# Patient Record
Sex: Male | Born: 1950 | Race: White | Hispanic: No | Marital: Married | Smoking: Never smoker
Health system: Southern US, Community
[De-identification: ages and names within clinical notes are randomized; demographics above are authoritative.]

## PROBLEM LIST (undated history)

## (undated) DIAGNOSIS — R112 Nausea with vomiting, unspecified: Secondary | ICD-10-CM

## (undated) DIAGNOSIS — M549 Dorsalgia, unspecified: Secondary | ICD-10-CM

## (undated) DIAGNOSIS — Z9889 Other specified postprocedural states: Secondary | ICD-10-CM

## (undated) DIAGNOSIS — G8929 Other chronic pain: Secondary | ICD-10-CM

## (undated) HISTORY — PX: BACK SURGERY: SHX140

---

## 2000-06-01 ENCOUNTER — Encounter: Admission: RE | Admit: 2000-06-01 | Discharge: 2000-08-30 | Payer: Self-pay | Admitting: Occupational Medicine

## 2000-07-06 ENCOUNTER — Encounter: Admission: RE | Admit: 2000-07-06 | Discharge: 2000-07-06 | Payer: Self-pay | Admitting: Occupational Medicine

## 2000-07-06 ENCOUNTER — Encounter: Payer: Self-pay | Admitting: Occupational Medicine

## 2000-08-18 ENCOUNTER — Inpatient Hospital Stay (HOSPITAL_COMMUNITY): Admission: AD | Admit: 2000-08-18 | Discharge: 2000-08-19 | Payer: Self-pay | Admitting: Neurosurgery

## 2000-08-18 ENCOUNTER — Encounter: Payer: Self-pay | Admitting: Neurosurgery

## 2000-11-17 ENCOUNTER — Encounter: Payer: Self-pay | Admitting: Neurosurgery

## 2000-11-17 ENCOUNTER — Ambulatory Visit (HOSPITAL_COMMUNITY): Admission: RE | Admit: 2000-11-17 | Discharge: 2000-11-17 | Payer: Self-pay | Admitting: Neurosurgery

## 2000-12-08 ENCOUNTER — Encounter: Payer: Self-pay | Admitting: Neurosurgery

## 2000-12-08 ENCOUNTER — Encounter: Admission: RE | Admit: 2000-12-08 | Discharge: 2000-12-08 | Payer: Self-pay | Admitting: Neurosurgery

## 2000-12-22 ENCOUNTER — Encounter: Admission: RE | Admit: 2000-12-22 | Discharge: 2000-12-22 | Payer: Self-pay | Admitting: Neurosurgery

## 2000-12-22 ENCOUNTER — Encounter: Payer: Self-pay | Admitting: Neurosurgery

## 2001-01-10 ENCOUNTER — Encounter: Admission: RE | Admit: 2001-01-10 | Discharge: 2001-01-10 | Payer: Self-pay | Admitting: Neurosurgery

## 2001-01-10 ENCOUNTER — Encounter: Payer: Self-pay | Admitting: Neurosurgery

## 2001-05-01 ENCOUNTER — Ambulatory Visit (HOSPITAL_BASED_OUTPATIENT_CLINIC_OR_DEPARTMENT_OTHER): Admission: RE | Admit: 2001-05-01 | Discharge: 2001-05-01 | Payer: Self-pay | Admitting: Plastic Surgery

## 2002-02-21 ENCOUNTER — Encounter (INDEPENDENT_AMBULATORY_CARE_PROVIDER_SITE_OTHER): Payer: Self-pay | Admitting: Specialist

## 2002-02-21 ENCOUNTER — Ambulatory Visit (HOSPITAL_COMMUNITY): Admission: RE | Admit: 2002-02-21 | Discharge: 2002-02-21 | Payer: Self-pay | Admitting: Gastroenterology

## 2002-02-21 ENCOUNTER — Encounter: Payer: Self-pay | Admitting: Gastroenterology

## 2006-09-02 ENCOUNTER — Encounter: Admission: RE | Admit: 2006-09-02 | Discharge: 2006-09-02 | Payer: Self-pay | Admitting: Neurosurgery

## 2010-06-26 NOTE — Op Note (Signed)
[place]. [facility]  Patient:    Thomas Navarro, Thomas Navarro Visit Number: [id number] MRN: [id number]          Service Type: DSU Location: [facility] Attending Physician:  Loura Halt Ii Dictated by:   Alfredia Ferguson, M.D. Proc. Date: 05/01/01 Admit Date:  05/01/2001 Discharge Date: 05/01/2001   CC:         Dr. Nita Sells   Operative Report  PREOPERATIVE DIAGNOSIS:  A 2 cm soft tissue mass, right temple.  POSTOPERATIVE DIAGNOSIS:  A 2 cm soft tissue mass, right temple.  OPERATION PERFORMED:  Excision of right temple soft tissue mass.  SURGEON:  Alfredia Ferguson, M.D.  ANESTHESIA:  2% Xylocaine 1:100,000 epinephrine.  INDICATIONS FOR PROCEDURE:  This is a 60 year old gentleman with a slowly enlarging subcutaneous mass in his right temple area.  He was evaluated by Dr. Nita Sells, who recommended removal.  He was referred to my office for this procedure.  The patient understands he will be trading what he has for a permanent potentially unsightly scar.  He understands the risk of recurrence of the lesion.  Clinically it appears to be a lipoma.  DESCRIPTION OF PROCEDURE:  Skin markers were placed directly over the lipoma and local anesthesia was infiltrated.  The right side of the face was prepped and draped in sterile fashion.  After waiting approximately 10 minutes, an incision was made directly over the lesion and using a combination of sharp and blunt dissection, the lipoma was visualized.  The lipoma was dissected out carefully to minimize any damage to the temporal branch of the facial nerve. It was removed in total.  The lesion was passed off for pathology.  Hemostasis was accomplished using electrocautery.  The wound was closed by approximating the dermis using interrupted 5-0 Vicryl suture.  The skin was united using a running 6-0 nylon suture.  The patient was discharged home in satisfactory condition. Dictated by:   Alfredia Ferguson,  M.D. Attending Physician:  Loura Halt Ii DD:  05/17/01 TD:  05/17/01 Job: [id number]

## 2010-06-26 NOTE — Op Note (Signed)
[place]. [facility]  Patient:    Thomas Navarro, Thomas Navarro                      MRN: [id number] Proc. Date: 08/18/00 Adm. Date:  [id number] Attending:  Barton Fanny                           Operative Report  PREOPERATIVE DIAGNOSIS:  Right L5-S1 lumbar disk herniation.  POSTOPERATIVE DIAGNOSIS:  Right L5-S1 lumbar disk herniation.  PROCEDURE:  Right L5-S1 lumbar laminotomy and microdiskectomy.  SURGEON:  Hewitt Shorts, M.D.  ASSISTANT:  Stefani Dama, M.D.  ANESTHESIA:  General endotracheal anesthesia.  INDICATIONS:  The patient is a 60 year old man who presented with a right lumbar radiculopathy that was found to be secondary to a right L5-S1 lumbar disk herniation.  Decision was made to proceed with a left laminotomy and microdiskectomy.  DESCRIPTION OF PROCEDURE:  The patient was brought to the operating room and placed under general endotracheal anesthesia.  The patient was turned to a prone position and the lumbar region was prepped with Betadine soap solution, draped in a sterile fashion.  The midline was infiltrated with local anesthetic with epinephrine and a midline incision was made, carried down through the subcutaneous tissue.  Bipolar cautery and electrocautery used to maintain hemostasis.  Dissection was carried down to the lumbar fascia which was incised on the right side of the midline in the paraspinal muscle with dissection of the spinous process and lamina in a subperiosteal fashion.  The L5-S1 intralaminar space was identified and x-ray was taken to confirm the localization, and then we proceeded with the laminotomy using the [facility] Max drill and Kerrison punches.  The microscope was draped and brought into the field to provide additional magnification, identification, illumination and visualization, and the remainder of the procedure was performed using microdissection and microsurgical technique.  The ligamentum of  flavum was carefully resected and we identified the thecal sac and right S1 nerve root. These were gently retracted medially revealing a subligamentous disk herniation.  The annulus was incised and a diskectomy was performed removing all lose fragments of disk material from both the disk space and from within the annulus as well as from the epidural space.  In the end all lose fragments were removed and good decompression of the thecal sac and nerve root was achieved.  The wound was irrigated with bacitracin solution.  A check for hemostasis was established with use of bipolar cautery and then we proceeded with closure. Prior to closure 2 cc of Fentanyl and 80 mg of Depo-Medrol were infused into the epidural space.  Deep fascia closed with interrupted undyed 1 Vicryl sutures and the subcutaneous and subcuticular were closed with interrupted and inverted 2-0 running Vicryl sutures, and the skin was reapproximated with Dermabond.  The patient tolerated the procedure well.  ESTIMATED BLOOD LOSS:  less than 50 cc.  SPONGE AND NEEDLE COUNT:  Correct.  Following surgery, the patient was turned back to the supine position to be reversed from the anesthetic, extubated and was transferred to the recovery room for further care. DD:  08/18/00 TD:  08/18/00 Job: [id number]

## 2010-06-26 NOTE — H&P (Signed)
[place]. [facility]  Patient:    Thomas Navarro, Thomas Navarro                      MRN: [id number] Adm. Date:  [id number] Attending:  Barton Fanny                         History and Physical  HISTORY OF PRESENT ILLNESS:  The patient is a 60 year old right-handed white male who was evaluated for a right lumbar radiculopathy with right L5-S1 lumbar disk herniation.  He injured himself in March 2002.  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  He initially had quite a bit of pain but it eased but he has continued to have complaints of pain in the right side of his low back extending down to the right buttock and posterior thigh and at times into the right calf with numbness and tingling in the right foot.  He does not describe any specific weakness.  He denies any symptoms in his left lower extremity.  He has continued working.  He has been treated with Celebrex, which he took for some time and it did help but then he began to not tolerate the side effects of the medication and he had to discontinue it.  He has also used some Skelaxin and underwent three weeks of physical therapy at [facility].  Again, this helped some but none of these have completely relieved his radicular pain.  He is an active person and likes to remain active both at work and at home and he has been limited because of his discomfort.  The patient is admitted now for right L5-S1 lumbar laminotomy and microdiskectomy.  PAST MEDICAL HISTORY:  There is no history of hypertension, myocardial infarction, cancer, stroke, diabetes, peptic ulcer disease, or lung disease.  PAST SURGICAL HISTORY:  No previous surgeries.  ALLERGIES:  No known allergies.  MEDICATIONS:  He is currently not taking any medications.  FAMILY HISTORY:  His mother died at age 34 of breast cancer and his father is in good health with a history of heart disease at age 56.  SOCIAL  HISTORY:  The patient is married.  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  He does not smoke.  He drinks alcoholic beverages socially.  He denies history of substance abuse.  REVIEW OF SYSTEMS:  Notable for those difficulties described in his history of present illness and past medical history.  PHYSICAL EXAMINATION:  GENERAL:  The patient is a well-developed, well-nourished white male in no acute distress.  VITAL SIGNS:  Temperature 98.3, pulse 62, blood pressure 138/66, respiratory rate 16, height 5 feet 11 inches, weight 221 pounds.  LUNGS:  Clear to auscultation.  He has symmetrical respiration excursion.  HEART:  Regular rate and rhythm.  Normal S1, S2.  There is no murmur.  ABDOMEN:  Soft, nondistended.  Bowel sounds are present.  EXTREMITIES:  No clubbing, cyanosis, or edema.  MUSCULOSKELETAL:  No particular tenderness to palpation over the lumbar spinous process or ______ musculature.  He is able to flex 90 degrees.  He is able to extend.  He has more discomfort with flexion than with extension. Straight leg raising is negative bilaterally.  NEUROLOGIC:  5/5 strength in the distal lower extremities.   Equal in the dorsiflexor, plantiflexor, and extensor hallucis longus.  Sensation is  somewhat decreased to pinprick in the right foot as compared to the left foot. Reflexes are 1 at the quadriceps and gastrocnemii symmetric bilaterally.  Toes are downgoing bilaterally.  He has a normal gait and stance.  LABORATORY DATA:  MRI from [facility] shows desiccation of the L5-S1 disk with a small right L5-S1 lumbar disk herniation.  IMPRESSION:  Right lumbar radiculopathy secondary to a right L5-S1 lumbar disk herniation.  PLAN:  The patient will be admitted for a right L5-S1 lumbar laminotomy and microdiskectomy.  We discussed alternatives to surgery, the nature of the surgical procedure itself, typical length of surgery and hospital stay and overall  recuperation; and risks of surgery including risk of infection, bleeding, possibility of transfusion, the risk of nerve dysfunction, pain, weakness, ______, the risk of recurrent disk herniation, possible need for further surgery, the risk of dural tear and CSF leakage; and anesthetic risks of myocardial infarction, stroke, pneumonia, and death.  Understanding all of this, he does with to proceed with surgery and is admitted for such. DD:  08/18/00 TD:  08/18/00 Job: [id number]

## 2011-03-23 ENCOUNTER — Ambulatory Visit (HOSPITAL_BASED_OUTPATIENT_CLINIC_OR_DEPARTMENT_OTHER): Payer: Managed Care, Other (non HMO) | Admitting: Physical Medicine & Rehabilitation

## 2011-03-23 ENCOUNTER — Encounter: Payer: Managed Care, Other (non HMO) | Attending: Physical Medicine & Rehabilitation

## 2011-03-23 DIAGNOSIS — IMO0002 Reserved for concepts with insufficient information to code with codable children: Secondary | ICD-10-CM

## 2011-03-23 DIAGNOSIS — M961 Postlaminectomy syndrome, not elsewhere classified: Secondary | ICD-10-CM

## 2011-03-23 DIAGNOSIS — IMO0001 Reserved for inherently not codable concepts without codable children: Secondary | ICD-10-CM | POA: Insufficient documentation

## 2011-03-23 DIAGNOSIS — M79609 Pain in unspecified limb: Secondary | ICD-10-CM | POA: Insufficient documentation

## 2011-03-23 DIAGNOSIS — Z79899 Other long term (current) drug therapy: Secondary | ICD-10-CM | POA: Insufficient documentation

## 2011-03-23 DIAGNOSIS — G8929 Other chronic pain: Secondary | ICD-10-CM | POA: Insufficient documentation

## 2011-03-23 DIAGNOSIS — G96198 Other disorders of meninges, not elsewhere classified: Secondary | ICD-10-CM | POA: Insufficient documentation

## 2011-03-23 NOTE — Consult Note (Signed)
Consult requested by Dr. Andrey Campanile over at the [facility].  REASON FOR CONSULTATION:  Right lower extremity pain.  CHIEF COMPLAINT:  Right lower extremity pain.  HISTORY:  A 61 year old male who has a history of chronic back and lower extremity pain.  He rates his current pain as 6/10.  It goes down the back of his buttocks into the posterior thigh, into his calf, and then into his foot.  His big toe is the numbest.  He has undergone epidural steroid injections in the past.  MRI of the spine has been done in 2002, which was the initial, showing a paracentral disk herniation approaching the right S1 nerve root, had a repeat after laminectomy on August 18, 2000, which showed the laminotomy defect, some perineural fibrosis, and foraminal stenosis at L5-S1.  He has had epidural steroids transforaminal right L5 performed on December 08, 2000; December 22, 2000; and January 10, 2001, which were helpful.  He has not had any recent injections.  A repeat MRI of the lumbar spine August 03, 2006, demonstrating some endplate reactive changes L5-S1, moderate facet hypertrophy L4-5, right greater than left foraminal stenosis at L5-S1, facet arthropathy and foraminal and extra foraminal protrusion.  I reviewed the MRIs on the [facility] system as well.  Foraminal stenosis on the right is moderate.  Average pain score is 6, sharp and burning down the right lower extremity.  He can walk without problems.  He has problems with prolonged sitting.  He is able climb steps.  He is able to drive.  Needs help with certain household duties.  He has numbness and spasms on his review of systems as well as weight gain.  SOCIAL HISTORY:  Married, lives with his wife.  Nonsmoker and nondrinker. 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  He really wants to hold off on any type of surgery until November 2013.  Opioid risk tool score of 0.  MEDICATIONS: 1. Gabapentin 300 mg 2 during the day and 3  at night. 2. Nabumetone 500 mg b.i.d. 3. AndroGel 2 pumps daily. 4. Diazepam 5 mg t.i.d. 5. OxyContin 10 mg q.12. 6. Hydrocodone 5/325 one to two q.4-6.  I also reviewed records from Dr. Mellody Life office as well as Dr. Tawana Scale office.  I also reviewed MRI report, but not the actual films, March 03, 2010, which was not significantly different than prior exam.  Oswestry score of 48%.  PHYSICAL EXAMINATION:  VITAL SIGNS:  Blood pressure 149/88, pulse 88, respiratory rate 16, O2 saturation 98% on room air, weight 208 pounds, and height 5 feet 11 inches. GENERAL:  An overweight male, in no acute distress.  Orientation x3. Mood and affect are appropriate. MUSCULOSKELETAL:  Upper extremity strength, range of motion, sensation, and deep tendon reflexes are normal.  Lower extremity strength and deep tendon reflexes are normal.  Sensation reduced in the right L5 dermatomal distribution.  He has a weak EHL on the right side, but otherwise has good strength in the lower extremities.  Gait shows no evidence of toe drag or knee instability, although he walks slowly trying to have a soft heel strike on the right side.  IMPRESSION: 1. 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  He is     contemplating surgery in the future, but would like to trial some     conservative care for the next 9 months at minimum.  We will set     him up for L5 transforaminal injection given that he  has had good     luck with these in the past prior to his surgery. 2. Start some nortriptyline. 3. Check urine drug screen.  Consider other options in terms of     narcotic analgesics such as Nucynta, which may address his     neurogenic pain better than the OxyContin and hydrocodone, tend to     be less sedating as well given type of work he does.  Discussed with the patient, agrees with plan.     Erick Colace, M.D. Electronically Signed    AEK/MedQ D:03/23/2011 15:58:00  T:03/23/2011  22:17:10  Job #:  [id number]  cc:   Hewitt Shorts, M.D. Fax: [phone]

## 2011-03-30 ENCOUNTER — Other Ambulatory Visit: Payer: Self-pay | Admitting: *Deleted

## 2011-03-30 MED ORDER — HYDROCODONE-ACETAMINOPHEN 7.5-325 MG PO TABS: 1.0000 | ORAL_TABLET | Freq: Four times a day (QID) | ORAL | Status: AC

## 2012-02-24 ENCOUNTER — Other Ambulatory Visit: Payer: Self-pay | Admitting: Internal Medicine

## 2012-02-24 DIAGNOSIS — K7460 Unspecified cirrhosis of liver: Secondary | ICD-10-CM

## 2012-02-24 DIAGNOSIS — B1920 Unspecified viral hepatitis C without hepatic coma: Secondary | ICD-10-CM

## 2012-04-13 ENCOUNTER — Ambulatory Visit
Admission: RE | Admit: 2012-04-13 | Discharge: 2012-04-13 | Disposition: A | Discharge status: Home / Self Care | Payer: Managed Care, Other (non HMO) | Source: Ambulatory Visit | Attending: Internal Medicine | Admitting: Internal Medicine

## 2012-04-13 DIAGNOSIS — B1920 Unspecified viral hepatitis C without hepatic coma: Secondary | ICD-10-CM

## 2012-04-13 DIAGNOSIS — K7460 Unspecified cirrhosis of liver: Secondary | ICD-10-CM

## 2012-04-27 ENCOUNTER — Other Ambulatory Visit: Payer: Self-pay | Admitting: Physician Assistant

## 2012-04-27 DIAGNOSIS — K7689 Other specified diseases of liver: Secondary | ICD-10-CM

## 2012-05-04 ENCOUNTER — Ambulatory Visit
Admission: RE | Admit: 2012-05-04 | Discharge: 2012-05-04 | Disposition: A | Discharge status: Home / Self Care | Payer: Managed Care, Other (non HMO) | Source: Ambulatory Visit | Attending: Physician Assistant | Admitting: Physician Assistant

## 2012-05-04 DIAGNOSIS — K7689 Other specified diseases of liver: Secondary | ICD-10-CM

## 2012-08-01 ENCOUNTER — Telehealth: Payer: Self-pay | Admitting: *Deleted

## 2012-08-01 NOTE — Telephone Encounter (Signed)
I called and left a message for the patient to callback to the office to r/s appt.with Dr. Willis. 

## 2013-03-13 ENCOUNTER — Emergency Department (HOSPITAL_COMMUNITY)
Admission: EM | Admit: 2013-03-13 | Discharge: 2013-03-13 | Disposition: A | Discharge status: Home / Self Care | Payer: Managed Care, Other (non HMO) | Attending: Emergency Medicine | Admitting: Emergency Medicine

## 2013-03-13 ENCOUNTER — Emergency Department (HOSPITAL_COMMUNITY): Payer: Managed Care, Other (non HMO)

## 2013-03-13 ENCOUNTER — Encounter (HOSPITAL_COMMUNITY): Payer: Self-pay | Admitting: Emergency Medicine

## 2013-03-13 DIAGNOSIS — Z79899 Other long term (current) drug therapy: Secondary | ICD-10-CM | POA: Insufficient documentation

## 2013-03-13 DIAGNOSIS — G8929 Other chronic pain: Secondary | ICD-10-CM | POA: Insufficient documentation

## 2013-03-13 DIAGNOSIS — N23 Unspecified renal colic: Secondary | ICD-10-CM

## 2013-03-13 DIAGNOSIS — D696 Thrombocytopenia, unspecified: Secondary | ICD-10-CM

## 2013-03-13 DIAGNOSIS — M549 Dorsalgia, unspecified: Secondary | ICD-10-CM | POA: Insufficient documentation

## 2013-03-13 DIAGNOSIS — R1032 Left lower quadrant pain: Secondary | ICD-10-CM | POA: Insufficient documentation

## 2013-03-13 DIAGNOSIS — R109 Unspecified abdominal pain: Secondary | ICD-10-CM

## 2013-03-13 DIAGNOSIS — R1031 Right lower quadrant pain: Secondary | ICD-10-CM | POA: Insufficient documentation

## 2013-03-13 HISTORY — DX: Other chronic pain: G89.29

## 2013-03-13 HISTORY — DX: Dorsalgia, unspecified: M54.9

## 2013-03-13 LAB — URINE MICROSCOPIC-ADD ON

## 2013-03-13 LAB — URINALYSIS, ROUTINE W REFLEX MICROSCOPIC
BILIRUBIN URINE: NEGATIVE
Glucose, UA: 250 mg/dL — AB
KETONES UR: NEGATIVE mg/dL
Leukocytes, UA: NEGATIVE
Nitrite: NEGATIVE
Protein, ur: NEGATIVE mg/dL
SPECIFIC GRAVITY, URINE: 1.045 — AB (ref 1.005–1.030)
UROBILINOGEN UA: 0.2 mg/dL (ref 0.0–1.0)
pH: 5 (ref 5.0–8.0)

## 2013-03-13 LAB — COMPREHENSIVE METABOLIC PANEL
ALT: 66 U/L — AB (ref 0–53)
AST: 29 U/L (ref 0–37)
Albumin: 3 g/dL — ABNORMAL LOW (ref 3.5–5.2)
Alkaline Phosphatase: 150 U/L — ABNORMAL HIGH (ref 39–117)
BUN: 14 mg/dL (ref 6–23)
CALCIUM: 10.4 mg/dL (ref 8.4–10.5)
CHLORIDE: 100 meq/L (ref 96–112)
CO2: 19 meq/L (ref 19–32)
Creatinine, Ser: 1.34 mg/dL (ref 0.50–1.35)
GFR calc Af Amer: 65 mL/min — ABNORMAL LOW (ref >90)
GFR, EST NON AFRICAN AMERICAN: 56 mL/min — AB (ref >90)
GLUCOSE: 185 mg/dL — AB (ref 70–99)
Potassium: 4.7 mEq/L (ref 3.7–5.3)
SODIUM: 134 meq/L — AB (ref 137–147)
Total Bilirubin: 0.7 mg/dL (ref 0.3–1.2)
Total Protein: 6.9 g/dL (ref 6.0–8.3)

## 2013-03-13 LAB — CBC WITH DIFFERENTIAL/PLATELET
Basophils Absolute: 0 10*3/uL (ref 0.0–0.1)
Basophils Relative: 0 % (ref 0–1)
EOS ABS: 0 10*3/uL (ref 0.0–0.7)
Eosinophils Relative: 0 % (ref 0–5)
HCT: 42.4 % (ref 39.0–52.0)
HEMOGLOBIN: 14.7 g/dL (ref 13.0–17.0)
LYMPHS ABS: 1.1 10*3/uL (ref 0.7–4.0)
LYMPHS PCT: 12 % (ref 12–46)
MCH: 33.4 pg (ref 26.0–34.0)
MCHC: 34.7 g/dL (ref 30.0–36.0)
MCV: 96.4 fL (ref 78.0–100.0)
MONOS PCT: 5 % (ref 3–12)
Monocytes Absolute: 0.5 10*3/uL (ref 0.1–1.0)
Neutro Abs: 7.5 10*3/uL (ref 1.7–7.7)
Neutrophils Relative %: 83 % — ABNORMAL HIGH (ref 43–77)
PLATELETS: 45 10*3/uL — AB (ref 150–400)
RBC: 4.4 MIL/uL (ref 4.22–5.81)
RDW: 13.7 % (ref 11.5–15.5)
WBC: 9.1 10*3/uL (ref 4.0–10.5)

## 2013-03-13 LAB — LIPASE, BLOOD: LIPASE: 32 U/L (ref 11–59)

## 2013-03-13 MED ORDER — HYDROMORPHONE HCL PF 1 MG/ML IJ SOLN
1.0000 mg | Freq: Once | INTRAMUSCULAR | Status: AC
  Administered 2013-03-13: 1 mg via INTRAVENOUS
  Filled 2013-03-13: qty 1

## 2013-03-13 MED ORDER — IOHEXOL 300 MG/ML  SOLN
80.0000 mL | Freq: Once | INTRAMUSCULAR | Status: AC | PRN
  Administered 2013-03-13: 80 mL via INTRAVENOUS

## 2013-03-13 MED ORDER — KETOROLAC TROMETHAMINE 30 MG/ML IJ SOLN
15.0000 mg | Freq: Once | INTRAMUSCULAR | Status: AC
  Administered 2013-03-13: 15 mg via INTRAVENOUS
  Filled 2013-03-13: qty 1

## 2013-03-13 MED ORDER — HYDROMORPHONE HCL PF 1 MG/ML IJ SOLN
2.0000 mg | Freq: Once | INTRAMUSCULAR | Status: AC
  Administered 2013-03-13: 2 mg via INTRAVENOUS
  Filled 2013-03-13: qty 2

## 2013-03-13 MED ORDER — IOHEXOL 300 MG/ML  SOLN
25.0000 mL | Freq: Once | INTRAMUSCULAR | Status: AC | PRN
  Administered 2013-03-13: 25 mL via ORAL

## 2013-03-13 MED ORDER — TAMSULOSIN HCL 0.4 MG PO CAPS: 0.4000 mg | ORAL_CAPSULE | Freq: Every day | ORAL | Status: DC

## 2013-03-13 MED ORDER — FENTANYL CITRATE 0.05 MG/ML IJ SOLN
50.0000 ug | Freq: Once | INTRAMUSCULAR | Status: AC
  Administered 2013-03-13: 50 ug via INTRAVENOUS

## 2013-03-13 MED ORDER — OXYCODONE HCL 5 MG PO TABS: 5.0000 mg | ORAL_TABLET | Freq: Four times a day (QID) | ORAL | Status: DC | PRN

## 2013-03-13 MED ORDER — FENTANYL CITRATE 0.05 MG/ML IJ SOLN
50.0000 ug | Freq: Once | INTRAMUSCULAR | Status: AC
  Administered 2013-03-13: 50 ug via INTRAVENOUS
  Filled 2013-03-13: qty 2

## 2013-03-13 NOTE — Discharge Instructions (Signed)
Kidney Stones  Kidney stones (urolithiasis) are deposits that form inside your kidneys. The intense pain is caused by the stone moving through the urinary tract. When the stone moves, the ureter goes into spasm around the stone. The stone is usually passed in the urine.   CAUSES   · A disorder that makes certain neck glands produce too much parathyroid hormone (primary hyperparathyroidism).  · A buildup of uric acid crystals, similar to gout in your joints.  · Narrowing (stricture) of the ureter.  · A kidney obstruction present at birth (congenital obstruction).  · Previous surgery on the kidney or ureters.  · Numerous kidney infections.  SYMPTOMS   · Feeling sick to your stomach (nauseous).  · Throwing up (vomiting).  · Blood in the urine (hematuria).  · Pain that usually spreads (radiates) to the groin.  · Frequency or urgency of urination.  DIAGNOSIS   · Taking a history and physical exam.  · Blood or urine tests.  · CT scan.  · Occasionally, an examination of the inside of the urinary bladder (cystoscopy) is performed.  TREATMENT   · Observation.  · Increasing your fluid intake.  · Extracorporeal shock wave lithotripsy This is a noninvasive procedure that uses shock waves to break up kidney stones.  · Surgery may be needed if you have severe pain or persistent obstruction. There are various surgical procedures. Most of the procedures are performed with the use of small instruments. Only small incisions are needed to accommodate these instruments, so recovery time is minimized.  The size, location, and chemical composition are all important variables that will determine the proper choice of action for you. Talk to your health care provider to better understand your situation so that you will minimize the risk of injury to yourself and your kidney.   HOME CARE INSTRUCTIONS   · Drink enough water and fluids to keep your urine clear or pale yellow. This will help you to pass the stone or stone fragments.  · Strain  all urine through the provided strainer. Keep all particulate matter and stones for your health care provider to see. The stone causing the pain may be as small as a grain of salt. It is very important to use the strainer each and every time you pass your urine. The collection of your stone will allow your health care provider to analyze it and verify that a stone has actually passed. The stone analysis will often identify what you can do to reduce the incidence of recurrences.  · Only take over-the-counter or prescription medicines for pain, discomfort, or fever as directed by your health care provider.  · Make a follow-up appointment with your health care provider as directed.  · Get follow-up X-rays if required. The absence of pain does not always mean that the stone has passed. It may have only stopped moving. If the urine remains completely obstructed, it can cause loss of kidney function or even complete destruction of the kidney. It is your responsibility to make sure X-rays and follow-ups are completed. Ultrasounds of the kidney can show blockages and the status of the kidney. Ultrasounds are not associated with any radiation and can be performed easily in a matter of minutes.  SEEK MEDICAL CARE IF:  · You experience pain that is progressive and unresponsive to any pain medicine you have been prescribed.  SEEK IMMEDIATE MEDICAL CARE IF:   · Pain cannot be controlled with the prescribed medicine.  · You have a fever   or shaking chills.  · The severity or intensity of pain increases over 18 hours and is not relieved by pain medicine.  · You develop a new onset of abdominal pain.  · You feel faint or pass out.  · You are unable to urinate.  MAKE SURE YOU:   · Understand these instructions.  · Will watch your condition.  · Will get help right away if you are not doing well or get worse.  Document Released: 01/25/2005 Document Revised: 09/27/2012 Document Reviewed: 06/28/2012  [employer]® Patient Information ©2014  [employer].

## 2013-03-13 NOTE — ED Notes (Signed)
Per EMS: pt coming from home with c/o abdominal pain. Pt reports onset around 2100 which was approximately three hours after he ate dinner. Pt reports pain in left and right lower abdomen, tender to palpation. Pt states abdomen is distended, last BM yesterday but not normal. Pt is A&Ox4, respirations equal and unlabored skin warm and dry.

## 2013-03-13 NOTE — ED Provider Notes (Addendum)
CSN: [id number]     Arrival date & time 03/13/13  0350 History   First MD Initiated Contact with Patient 03/13/13 0522     Chief Complaint  Patient presents with  . Abdominal Pain   (Consider location/radiation/quality/duration/timing/severity/associated sxs/prior Treatment) HPI Complains of diffuse abdominal pain onset last night gradually. It is sharp and severe. Denies nausea denies vomiting last bowel movement yesterday, he states less than normal. No urinary symptoms. Pain worse with movement. Treated with oxy Contin, without relief. No other associated symptoms No past medical history on file. past medical history chronic back pain No past surgical history on file. surgical history back surgery No family history on file. History  Substance Use Topics  . Smoking status: Not on file  . Smokeless tobacco: Not on file  . Alcohol Use: Not on file    non smoker no alcohol, former drinker. Never heavy drinker no illicit drugs Review of Systems  Constitutional: Negative.   HENT: Negative.   Respiratory: Negative.   Cardiovascular: Negative.   Gastrointestinal: Positive for abdominal pain.  Musculoskeletal: Positive for back pain.       Chronic back pain  Skin: Negative.   Neurological: Negative.   Psychiatric/Behavioral: Negative.   All other systems reviewed and are negative.    Allergies  Morphine and related  Home Medications   Current Outpatient Rx  Name  Route  Sig  Dispense  Refill  . diazepam (VALIUM) 5 MG tablet   Oral   Take 1 tablet by mouth 3 (three) times daily as needed for anxiety.          Marland Kitchen oxyCODONE-acetaminophen (PERCOCET/ROXICET) 5-325 MG per tablet   Oral   Take 1 tablet by mouth every 6 (six) hours as needed.         . OXYCONTIN 10 MG T12A 12 hr tablet   Oral   Take 1 tablet by mouth 2 (two) times daily.          BP 146/61  Pulse 76  Temp(Src) 98.9 F (37.2 C) (Oral)  Resp 21  SpO2 98% Physical Exam  Nursing note and vitals  reviewed. Constitutional: He appears well-developed and well-nourished. He appears distressed.  Marland Kitchen Uncomfortable appearing Os-Cal, score 15  HENT:  Head: Normocephalic and atraumatic.  Eyes: Conjunctivae are normal. Pupils are equal, round, and reactive to light.  Neck: Neck supple. No tracheal deviation present. No thyromegaly present.  Cardiovascular: Normal rate and regular rhythm.   No murmur heard. Pulmonary/Chest: Effort normal and breath sounds normal.  Abdominal: Soft. Bowel sounds are normal. He exhibits no distension. There is tenderness.  Diffusely tender. Worse over the left lower quadrant and right lower quadrant. No bruit no mass  Genitourinary: Penis normal.  Musculoskeletal: Normal range of motion. He exhibits no edema and no tenderness.  Neurological: He is alert. Coordination normal.  Skin: Skin is warm and dry. No rash noted.  Psychiatric: He has a normal mood and affect.    ED Course  Procedures (including critical care time) Labs Review Labs Reviewed  CBC WITH DIFFERENTIAL - Abnormal; Notable for the following:    Neutrophils Relative % 83 (*)    All other components within normal limits  LIPASE, BLOOD  COMPREHENSIVE METABOLIC PANEL  URINALYSIS, ROUTINE W REFLEX MICROSCOPIC   Imaging Review No results found.  EKG Interpretation   None      Bladder scan placed on patient, negligible amount urine in bladder  6:10 AM pain improved after treatment with intravenous opioids Results  for orders placed during the hospital encounter of 03/13/13  CBC WITH DIFFERENTIAL      Result Value Range   WBC 9.1  4.0 - 10.5 K/uL   RBC 4.40  4.22 - 5.81 MIL/uL   Hemoglobin 14.7  13.0 - 17.0 g/dL   HCT 42.4  39.0 - 52.0 %   MCV 96.4  78.0 - 100.0 fL   MCH 33.4  26.0 - 34.0 pg   MCHC 34.7  30.0 - 36.0 g/dL   RDW 13.7  11.5 - 15.5 %   Platelets 45 (*) 150 - 400 K/uL   Neutrophils Relative % 83 (*) 43 - 77 %   Neutro Abs 7.5  1.7 - 7.7 K/uL   Lymphocytes Relative 12   12 - 46 %   Lymphs Abs 1.1  0.7 - 4.0 K/uL   Monocytes Relative 5  3 - 12 %   Monocytes Absolute 0.5  0.1 - 1.0 K/uL   Eosinophils Relative 0  0 - 5 %   Eosinophils Absolute 0.0  0.0 - 0.7 K/uL   Basophils Relative 0  0 - 1 %   Basophils Absolute 0.0  0.0 - 0.1 K/uL  LIPASE, BLOOD      Result Value Range   Lipase 32  11 - 59 U/L  COMPREHENSIVE METABOLIC PANEL      Result Value Range   Sodium 134 (*) 137 - 147 mEq/L   Potassium 4.7  3.7 - 5.3 mEq/L   Chloride 100  96 - 112 mEq/L   CO2 19  19 - 32 mEq/L   Glucose, Bld 185 (*) 70 - 99 mg/dL   BUN 14  6 - 23 mg/dL   Creatinine, Ser 1.34  0.50 - 1.35 mg/dL   Calcium 10.4  8.4 - 10.5 mg/dL   Total Protein 6.9  6.0 - 8.3 g/dL   Albumin 3.0 (*) 3.5 - 5.2 g/dL   AST 29  0 - 37 U/L   ALT 66 (*) 0 - 53 U/L   Alkaline Phosphatase 150 (*) 39 - 117 U/L   Total Bilirubin 0.7  0.3 - 1.2 mg/dL   GFR calc non Af Amer 56 (*) >90 mL/min   GFR calc Af Amer 65 (*) >90 mL/min   No results found.  MDM  No diagnosis found. Pt signed out to Dr Wilson Singer at 705 am  Dx #1 abdominal pain #2 hyperglycemia #3 thrombocytopenia   Orlie Dakin, MD 03/13/13 8768  Orlie Dakin, MD 03/13/13 1157

## 2013-03-13 NOTE — ED Provider Notes (Signed)
Assumed care from [facility] in sign out with CT pending. CT significant for a proximal right ureteral stone with some hydronephrosis. Perinephric inflammation. Renal function is normal. Urinalysis is still pending. Plan continued pain control. Visualized portions of the lungs with interstitial infiltrate. Patient with no respiratory complaints. Liver additionally noted to be cirrhotic. When asked about alcohol use, pt initially relied. "Oh no." Then on continued questing, "I don't drink liquor anymore." Continues to drink beer though and most days.  Some changes in his LFTs and also thrombocytopenia. Suspect most likely related to alcohol abuse. H&H is normal. Denies any overt bleeding. Does not appear to be related to acute presentation.   Pain currently controlled. UA not overly concerning for infection. No fever. No leukocytosis. Plan continued pain control and expectant management. Urology follow-up. Return precautions including uncontrolled pain/vomiting, fever, inability to urinate, etc discussed. Outpt urology FU otherwise.   Ct Abdomen Pelvis W Contrast  03/13/2013   CLINICAL DATA:  Diffuse abdominal pain and distension.  EXAM: CT ABDOMEN AND PELVIS WITH CONTRAST  TECHNIQUE: Multidetector CT imaging of the abdomen and pelvis was performed using the standard protocol following bolus administration of intravenous contrast.  CONTRAST:  33mL OMNIPAQUE IOHEXOL 300 MG/ML  SOLN  COMPARISON:  DG ABDOMEN 1V dated 10/11/2012; CT ABD WO/W CM dated 05/04/2012  FINDINGS: The lung bases demonstrated mild diffuse hazy appearance. There is thickening of the interstitial markings. Areas of mild increased density projects within the dependent portions of the lung bases. A moderate-sized hiatal hernia is appreciated. Atherosclerotic calcification identified within coronary vessels.  A stable hypo attenuating nodule is appreciated within the posterior segment of the right lobe of the liver. This finding is again likely  representing a small hemangioma. The liver again demonstrates a fine micronodular border. The liver parenchyma demonstrates a fine reticular pattern. The liver is otherwise stable.  Stable splenomegaly is appreciated. A stable small 2 mm medullary midpole calculus is identified within the left kidney which is otherwise unremarkable. Left adrenal is unremarkable.  A stable 2 cm simple cyst is appreciated within the anterior midpole region of the right kidney. The previously described calculus in the medullary portion of the right kidney has migrated to the proximal right ureter appreciated on image 57 series 2. Mild pelvocaliectasis is identified. There is stranding within the perinephric fat of the right kidney. On delayed imaging there is a persistent nephrogram with delayed excretion of contrast into the collecting system. Evaluation of the right adrenal demonstrates a small 1.8 mm nodule within the lateral limb of the adrenal demonstrating Hounsfield units of 45. When compared to the previous study this finding is stable.  The pancreas is unremarkable.  There is no evidence of bowel obstruction, enteritis, colitis, diverticulitis, nor appendicitis. The appendix is identified and is unremarkable.  There is no evidence of abdominal aortic aneurysm. Celiac, SMA, IMA, portal vein, SMV are opacified. There is these are appreciated within the splenic hilum. There is no evidence of abdominal or pelvic loculated fluid collections, masses, nor pathologic sized adenopathy. Subcentimeter lymph nodes are appreciated within the porta hepatis region. Likely reactive.  There is no evidence of abdominal wall no inguinal hernia.  The osseous structures demonstrate no evidence of aggressive lesions. Areas of spondylosis are appreciated within the thoracic and lumbar spine.  IMPRESSION: 1. 6 mm calculus within the proximal right ureter. There is associated mild obstructive uropathy. Stranding is appreciated within the perinephric  fat which may represent a component of inflammatory change due to the  mild obstruction no pyelonephritis cannot be excluded clinically appropriate. 2. Small indeterminate right adrenal nodule. The size of the nodule is likely too small for further characterization with MRI and surveillance evaluation with noncontrast it CT in 12 months is recommended. 3. Stable nonobstructing calculus left kidney 4. Cirrhotic changes within the liver as well as portal hypertension 5. Small to moderate hiatal hernia 6. Interstitial infiltrate within the lung bases differential considerations are infectious versus inflammatory though component of pulmonary edema is also a diagnostic consideration.   Electronically Signed   By: Margaree Mackintosh M.D.   On: 03/13/2013 08:40     Virgel Manifold, MD 03/13/13 1018

## 2013-11-21 ENCOUNTER — Encounter (HOSPITAL_COMMUNITY): Payer: Self-pay | Admitting: Emergency Medicine

## 2013-11-21 ENCOUNTER — Emergency Department (HOSPITAL_COMMUNITY)
Admission: EM | Admit: 2013-11-21 | Discharge: 2013-11-21 | Disposition: A | Discharge status: Home / Self Care | Payer: BLUE CROSS/BLUE SHIELD | Attending: Emergency Medicine | Admitting: Emergency Medicine

## 2013-11-21 DIAGNOSIS — M79605 Pain in left leg: Secondary | ICD-10-CM | POA: Diagnosis present

## 2013-11-21 DIAGNOSIS — M79609 Pain in unspecified limb: Secondary | ICD-10-CM

## 2013-11-21 DIAGNOSIS — L03116 Cellulitis of left lower limb: Secondary | ICD-10-CM | POA: Insufficient documentation

## 2013-11-21 DIAGNOSIS — Z79899 Other long term (current) drug therapy: Secondary | ICD-10-CM | POA: Insufficient documentation

## 2013-11-21 DIAGNOSIS — G8929 Other chronic pain: Secondary | ICD-10-CM | POA: Diagnosis not present

## 2013-11-21 DIAGNOSIS — M7989 Other specified soft tissue disorders: Secondary | ICD-10-CM

## 2013-11-21 LAB — CBC WITH DIFFERENTIAL/PLATELET
Basophils Absolute: 0 10*3/uL (ref 0.0–0.1)
Basophils Relative: 1 % (ref 0–1)
Eosinophils Absolute: 0.1 10*3/uL (ref 0.0–0.7)
Eosinophils Relative: 2 % (ref 0–5)
HCT: 39.1 % (ref 39.0–52.0)
Hemoglobin: 13.1 g/dL (ref 13.0–17.0)
Lymphocytes Relative: 40 % (ref 12–46)
Lymphs Abs: 1.6 10*3/uL (ref 0.7–4.0)
MCH: 33 pg (ref 26.0–34.0)
MCHC: 33.5 g/dL (ref 30.0–36.0)
MCV: 98.5 fL (ref 78.0–100.0)
Monocytes Absolute: 0.2 10*3/uL (ref 0.1–1.0)
Monocytes Relative: 6 % (ref 3–12)
Neutro Abs: 2.1 10*3/uL (ref 1.7–7.7)
Neutrophils Relative %: 52 % (ref 43–77)
Platelets: 42 10*3/uL — ABNORMAL LOW (ref 150–400)
RBC: 3.97 MIL/uL — ABNORMAL LOW (ref 4.22–5.81)
RDW: 14.7 % (ref 11.5–15.5)
WBC: 4.1 10*3/uL (ref 4.0–10.5)

## 2013-11-21 LAB — BASIC METABOLIC PANEL
Anion gap: 8 (ref 5–15)
BUN: 15 mg/dL (ref 6–23)
CO2: 22 mEq/L (ref 19–32)
Calcium: 9.9 mg/dL (ref 8.4–10.5)
Chloride: 106 mEq/L (ref 96–112)
Creatinine, Ser: 0.88 mg/dL (ref 0.50–1.35)
GFR calc Af Amer: >90 mL/min (ref >90)
GFR calc non Af Amer: >90 mL/min (ref >90)
Glucose, Bld: 159 mg/dL — ABNORMAL HIGH (ref 70–99)
Potassium: 4.1 mEq/L (ref 3.7–5.3)
Sodium: 136 mEq/L — ABNORMAL LOW (ref 137–147)

## 2013-11-21 MED ORDER — VANCOMYCIN HCL IN DEXTROSE 1-5 GM/200ML-% IV SOLN: 1000.0000 mg | Freq: Three times a day (TID) | INTRAVENOUS | Status: DC

## 2013-11-21 MED ORDER — VANCOMYCIN HCL 10 G IV SOLR
1750.0000 mg | Freq: Once | INTRAVENOUS | Status: AC
  Administered 2013-11-21: 1750 mg via INTRAVENOUS
  Filled 2013-11-21: qty 1750

## 2013-11-21 MED ORDER — OXYCODONE-ACETAMINOPHEN 5-325 MG PO TABS
2.0000 | ORAL_TABLET | Freq: Once | ORAL | Status: AC
  Administered 2013-11-21: 2 via ORAL
  Filled 2013-11-21: qty 2

## 2013-11-21 MED ORDER — CEPHALEXIN 500 MG PO CAPS: 500.0000 mg | ORAL_CAPSULE | Freq: Four times a day (QID) | ORAL | Status: DC

## 2013-11-21 MED ORDER — VANCOMYCIN HCL 10 G IV SOLR
1500.0000 mg | Freq: Two times a day (BID) | INTRAVENOUS | Status: DC
  Filled 2013-11-21: qty 1500

## 2013-11-21 NOTE — ED Notes (Signed)
Pt undressed, in gown, on continuous pulse oximetry and blood pressure cuff; Rob, PA at bedside

## 2013-11-21 NOTE — ED Provider Notes (Signed)
CSN: [id number]     Arrival date & time 11/21/13  4970 History   First MD Initiated Contact with Patient 11/21/13 [phone]     Chief Complaint  Patient presents with  . Leg Pain     (Consider location/radiation/quality/duration/timing/severity/associated sxs/prior Treatment) HPI Comments:  Patient presents emergency department with chief complaint of left lower extremity pain and swelling. He states that he notices symptoms about one week ago. He reports associated erythema, but no discharge. He denies any fevers, chills, chest pain, shortness of breath, nausea, or vomiting. He has not taken anything to alleviate his symptoms. It is aggravated with palpation and movement. He denies any history of PE or DVT.  The history is provided by the patient. No language interpreter was used.    Past Medical History  Diagnosis Date  . Chronic back pain    Past Surgical History  Procedure Laterality Date  . Back surgery     History reviewed. No pertinent family history. History  Substance Use Topics  . Smoking status: Never Smoker   . Smokeless tobacco: Never Used  . Alcohol Use: Not on file     Comment: occasional beer    Review of Systems  Constitutional: Negative for fever and chills.  Respiratory: Negative for shortness of breath.   Cardiovascular: Negative for chest pain.  Gastrointestinal: Negative for nausea, vomiting, diarrhea and constipation.  Genitourinary: Negative for dysuria.  Skin: Positive for color change.  All other systems reviewed and are negative.     Allergies  Morphine and related  Home Medications   Prior to Admission medications   Medication Sig Start Date End Date Taking? Authorizing Provider  diazepam (VALIUM) 5 MG tablet Take 1 tablet by mouth 3 (three) times daily as needed for anxiety.  03/12/13   Historical Provider, MD  oxyCODONE (ROXICODONE) 5 MG immediate release tablet Take 1-2 tablets (5-10 mg total) by mouth every 6 (six) hours as needed for  severe pain. 03/13/13   Virgel Manifold, MD  OXYCONTIN 10 MG T12A 12 hr tablet Take 1 tablet by mouth 2 (two) times daily. 03/01/13   Historical Provider, MD  tamsulosin (FLOMAX) 0.4 MG CAPS capsule Take 1 capsule (0.4 mg total) by mouth daily. 03/13/13   Virgel Manifold, MD   BP 141/79  Pulse 90  Temp(Src) 98.1 F (36.7 C)  Resp 18  Ht 5\' 10"  (1.778 m)  Wt 210 lb (95.255 kg)  BMI 30.13 kg/m2  SpO2 98% Physical Exam  Nursing note and vitals reviewed. Constitutional: He is oriented to person, place, and time. He appears well-developed and well-nourished.  HENT:  Head: Normocephalic and atraumatic.  Eyes: Conjunctivae and EOM are normal. Pupils are equal, round, and reactive to light. Right eye exhibits no discharge. Left eye exhibits no discharge. No scleral icterus.  Neck: Normal range of motion. Neck supple. No JVD present.  Cardiovascular: Normal rate, regular rhythm and normal heart sounds.  Exam reveals no gallop and no friction rub.   No murmur heard. Pulmonary/Chest: Effort normal and breath sounds normal. No respiratory distress. He has no wheezes. He has no rales. He exhibits no tenderness.  Abdominal: Soft. He exhibits no distension. There is no tenderness.  Musculoskeletal: Normal range of motion. He exhibits no edema and no tenderness.  Left lower extremity range of motion and strength 5/5, mild swelling about the left ankle  Neurological: He is alert and oriented to person, place, and time.  Skin: Skin is warm and dry.  4 x 4 centimeter  erythematous area to the medial left lower extremity, no evidence of induration, abscess, or discharge, appears to be cellulitic without abscess  Psychiatric: He has a normal mood and affect. His behavior is normal. Judgment and thought content normal.    ED Course  Procedures (including critical care time) Results for orders placed during the hospital encounter of 11/21/13  CBC WITH DIFFERENTIAL      Result Value Ref Range   WBC 4.1  4.0 -  10.5 K/uL   RBC 3.97 (*) 4.22 - 5.81 MIL/uL   Hemoglobin 13.1  13.0 - 17.0 g/dL   HCT 39.1  39.0 - 52.0 %   MCV 98.5  78.0 - 100.0 fL   MCH 33.0  26.0 - 34.0 pg   MCHC 33.5  30.0 - 36.0 g/dL   RDW 14.7  11.5 - 15.5 %   Platelets 42 (*) 150 - 400 K/uL   Neutrophils Relative % 52  43 - 77 %   Neutro Abs 2.1  1.7 - 7.7 K/uL   Lymphocytes Relative 40  12 - 46 %   Lymphs Abs 1.6  0.7 - 4.0 K/uL   Monocytes Relative 6  3 - 12 %   Monocytes Absolute 0.2  0.1 - 1.0 K/uL   Eosinophils Relative 2  0 - 5 %   Eosinophils Absolute 0.1  0.0 - 0.7 K/uL   Basophils Relative 1  0 - 1 %   Basophils Absolute 0.0  0.0 - 0.1 K/uL  BASIC METABOLIC PANEL      Result Value Ref Range   Sodium 136 (*) 137 - 147 mEq/L   Potassium 4.1  3.7 - 5.3 mEq/L   Chloride 106  96 - 112 mEq/L   CO2 22  19 - 32 mEq/L   Glucose, Bld 159 (*) 70 - 99 mg/dL   BUN 15  6 - 23 mg/dL   Creatinine, Ser 0.88  0.50 - 1.35 mg/dL   Calcium 9.9  8.4 - 10.5 mg/dL   GFR calc non Af Amer >90  >90 mL/min   GFR calc Af Amer >90  >90 mL/min   Anion gap 8  5 - 15   No results found.   Imaging Review No results found.   EKG Interpretation None      MDM   Final diagnoses:  Cellulitis of left lower extremity    Patient with probable cellulitis of left lower extremity. Will treat pain, and start antibiotics. Will reassess. Doubt DVT.  VASCULAR LAB  PRELIMINARY PRELIMINARY PRELIMINARY PRELIMINARY  Left lower extremity venous duplex completed.  Preliminary report: Left: No evidence of DVT, superficial thrombosis, or Baker's cyst.    Treated with Vanc in the ED.  Skin marked with skin marker.  DC to home with abx.  Patient has Oxycodone at home for pain.  Return precautions given.  Patient understands and agrees with the plan.  Patient seen by and discussed with Dr. Sabra Heck.    Montine Circle, PA-C 11/21/13 1438

## 2013-11-21 NOTE — Discharge Instructions (Signed)

## 2013-11-21 NOTE — Progress Notes (Signed)
ANTIBIOTIC CONSULT NOTE - INITIAL  Pharmacy Consult:  Vancomycin Indication:  Cellulitis  Allergies  Allergen Reactions  . Morphine And Related Nausea And Vomiting    Patient Measurements: Height: 5\' 10"  (177.8 cm) Weight: 210 lb (95.255 kg) IBW/kg (Calculated) : 73  Vital Signs: Temp: 98.1 F (36.7 C) (10/14 0936) BP: 141/79 mmHg (10/14 0945) Pulse Rate: 90 (10/14 0945)  Labs: No results found for this basename: WBC, HGB, PLT, LABCREA, CREATININE,  in the last 72 hours Estimated Creatinine Clearance: 67.9 ml/min (by C-G formula based on Cr of 1.34). No results found for this basename: VANCOTROUGH, VANCOPEAK, VANCORANDOM, GENTTROUGH, GENTPEAK, GENTRANDOM, TOBRATROUGH, TOBRAPEAK, TOBRARND, AMIKACINPEAK, AMIKACINTROU, AMIKACIN,  in the last 72 hours   Microbiology: No results found for this or any previous visit (from the past 720 hour(s)).  Medical History: Past Medical History  Diagnosis Date  . Chronic back pain       Assessment: 62 YOM with no significant PMH presented with complaints of LLE pain, redness and swelling for one week.  Pharmacy consulted to manage vancomycin for cellulitis.  Baseline labs reviewed.   Goal of Therapy:  Vancomycin trough level 10-15 mcg/ml   Plan:  - Vanc 1750mg  IV x 1, then 1500mg  IV Q12H - Monitor renal fxn, clinical course, vanc trough at [facility] - Watch plts    Landree Fernholz D. Mina Marble, PharmD, BCPS Pager:  [phone] 11/21/2013, 10:55 AM

## 2013-11-21 NOTE — ED Provider Notes (Signed)
Medical screening examination/treatment/procedure(s) were conducted as a shared visit with non-physician practitioner(s) and myself.  I personally evaluated the patient during the encounter  Please see my separate respective documentation pertaining to this patient encounter   Johnna Acosta, MD 11/21/13 2113

## 2013-11-21 NOTE — Progress Notes (Signed)
VASCULAR LAB PRELIMINARY  PRELIMINARY  PRELIMINARY  PRELIMINARY  Left lower extremity venous duplex completed.    Preliminary report:  Left:  No evidence of DVT, superficial thrombosis, or Baker's cyst.  Cyani Kallstrom, RVT 11/21/2013, 11:42 AM

## 2013-11-21 NOTE — ED Notes (Signed)
Per pt sts LLE pain, redness and swelling x 1 week.

## 2013-11-21 NOTE — ED Provider Notes (Signed)
63 year old male, history of swelling of the left lower extremity, this is only started in the last couple of days, on exam he has tenderness and swelling to the left lower extremity with pitting edema below the knee, a small area of erythema on the medial left ankle, only scant pitting edema to the right lower extremity. No other risk factors for DVT, check ultrasound, no leukocytosis, rule out DVT, treat possible cellulitis if negative.    [place] neg for DVT, tx for cellulitis  Medical screening examination/treatment/procedure(s) were conducted as a shared visit with non-physician practitioner(s) and myself.  I personally evaluated the patient during the encounter.  Clinical Impression:   Final diagnoses:  Cellulitis of left lower extremity         Johnna Acosta, MD 11/21/13 2114

## 2014-05-22 ENCOUNTER — Emergency Department (HOSPITAL_COMMUNITY): Payer: BLUE CROSS/BLUE SHIELD

## 2014-05-22 ENCOUNTER — Inpatient Hospital Stay (HOSPITAL_COMMUNITY)
Admission: EM | Admit: 2014-05-22 | Discharge: 2014-06-07 | DRG: 640 | Disposition: A | Discharge status: Hospice | Payer: BLUE CROSS/BLUE SHIELD | Attending: Internal Medicine | Admitting: Internal Medicine

## 2014-05-22 ENCOUNTER — Encounter (HOSPITAL_COMMUNITY): Payer: Self-pay | Admitting: Emergency Medicine

## 2014-05-22 DIAGNOSIS — Z885 Allergy status to narcotic agent status: Secondary | ICD-10-CM | POA: Diagnosis not present

## 2014-05-22 DIAGNOSIS — E871 Hypo-osmolality and hyponatremia: Secondary | ICD-10-CM | POA: Diagnosis present

## 2014-05-22 DIAGNOSIS — Y92230 Patient room in hospital as the place of occurrence of the external cause: Secondary | ICD-10-CM

## 2014-05-22 DIAGNOSIS — D6959 Other secondary thrombocytopenia: Secondary | ICD-10-CM | POA: Diagnosis present

## 2014-05-22 DIAGNOSIS — Z8249 Family history of ischemic heart disease and other diseases of the circulatory system: Secondary | ICD-10-CM

## 2014-05-22 DIAGNOSIS — Z79899 Other long term (current) drug therapy: Secondary | ICD-10-CM

## 2014-05-22 DIAGNOSIS — E512 Wernicke's encephalopathy: Principal | ICD-10-CM | POA: Diagnosis present

## 2014-05-22 DIAGNOSIS — R278 Other lack of coordination: Secondary | ICD-10-CM | POA: Diagnosis present

## 2014-05-22 DIAGNOSIS — K7290 Hepatic failure, unspecified without coma: Secondary | ICD-10-CM | POA: Diagnosis present

## 2014-05-22 DIAGNOSIS — G8929 Other chronic pain: Secondary | ICD-10-CM | POA: Diagnosis present

## 2014-05-22 DIAGNOSIS — W19XXXA Unspecified fall, initial encounter: Secondary | ICD-10-CM | POA: Diagnosis not present

## 2014-05-22 DIAGNOSIS — M549 Dorsalgia, unspecified: Secondary | ICD-10-CM | POA: Diagnosis present

## 2014-05-22 DIAGNOSIS — Z79891 Long term (current) use of opiate analgesic: Secondary | ICD-10-CM | POA: Diagnosis not present

## 2014-05-22 DIAGNOSIS — C227 Other specified carcinomas of liver: Secondary | ICD-10-CM | POA: Diagnosis present

## 2014-05-22 DIAGNOSIS — R0902 Hypoxemia: Secondary | ICD-10-CM

## 2014-05-22 DIAGNOSIS — F05 Delirium due to known physiological condition: Secondary | ICD-10-CM | POA: Diagnosis present

## 2014-05-22 DIAGNOSIS — K7031 Alcoholic cirrhosis of liver with ascites: Secondary | ICD-10-CM | POA: Diagnosis not present

## 2014-05-22 DIAGNOSIS — R933 Abnormal findings on diagnostic imaging of other parts of digestive tract: Secondary | ICD-10-CM | POA: Diagnosis not present

## 2014-05-22 DIAGNOSIS — F10239 Alcohol dependence with withdrawal, unspecified: Secondary | ICD-10-CM | POA: Diagnosis present

## 2014-05-22 DIAGNOSIS — F1010 Alcohol abuse, uncomplicated: Secondary | ICD-10-CM | POA: Diagnosis present

## 2014-05-22 DIAGNOSIS — Z8673 Personal history of transient ischemic attack (TIA), and cerebral infarction without residual deficits: Secondary | ICD-10-CM

## 2014-05-22 DIAGNOSIS — R932 Abnormal findings on diagnostic imaging of liver and biliary tract: Secondary | ICD-10-CM | POA: Diagnosis not present

## 2014-05-22 DIAGNOSIS — I509 Heart failure, unspecified: Secondary | ICD-10-CM | POA: Diagnosis not present

## 2014-05-22 DIAGNOSIS — J9601 Acute respiratory failure with hypoxia: Secondary | ICD-10-CM | POA: Insufficient documentation

## 2014-05-22 DIAGNOSIS — F10230 Alcohol dependence with withdrawal, uncomplicated: Secondary | ICD-10-CM | POA: Diagnosis not present

## 2014-05-22 DIAGNOSIS — Z781 Physical restraint status: Secondary | ICD-10-CM

## 2014-05-22 DIAGNOSIS — Z87442 Personal history of urinary calculi: Secondary | ICD-10-CM

## 2014-05-22 DIAGNOSIS — D689 Coagulation defect, unspecified: Secondary | ICD-10-CM | POA: Diagnosis present

## 2014-05-22 DIAGNOSIS — D696 Thrombocytopenia, unspecified: Secondary | ICD-10-CM | POA: Diagnosis not present

## 2014-05-22 DIAGNOSIS — R894 Abnormal immunological findings in specimens from other organs, systems and tissues: Secondary | ICD-10-CM | POA: Diagnosis not present

## 2014-05-22 DIAGNOSIS — R4182 Altered mental status, unspecified: Secondary | ICD-10-CM | POA: Insufficient documentation

## 2014-05-22 DIAGNOSIS — R410 Disorientation, unspecified: Secondary | ICD-10-CM

## 2014-05-22 DIAGNOSIS — R160 Hepatomegaly, not elsewhere classified: Secondary | ICD-10-CM | POA: Diagnosis not present

## 2014-05-22 DIAGNOSIS — C7800 Secondary malignant neoplasm of unspecified lung: Secondary | ICD-10-CM | POA: Diagnosis present

## 2014-05-22 DIAGNOSIS — M25552 Pain in left hip: Secondary | ICD-10-CM | POA: Diagnosis not present

## 2014-05-22 DIAGNOSIS — Z8619 Personal history of other infectious and parasitic diseases: Secondary | ICD-10-CM | POA: Diagnosis not present

## 2014-05-22 DIAGNOSIS — F10939 Alcohol use, unspecified with withdrawal, unspecified: Secondary | ICD-10-CM | POA: Diagnosis present

## 2014-05-22 DIAGNOSIS — E860 Dehydration: Secondary | ICD-10-CM | POA: Diagnosis present

## 2014-05-22 DIAGNOSIS — G9340 Encephalopathy, unspecified: Secondary | ICD-10-CM | POA: Diagnosis present

## 2014-05-22 DIAGNOSIS — M25572 Pain in left ankle and joints of left foot: Secondary | ICD-10-CM | POA: Diagnosis not present

## 2014-05-22 DIAGNOSIS — Z515 Encounter for palliative care: Secondary | ICD-10-CM

## 2014-05-22 DIAGNOSIS — F10231 Alcohol dependence with withdrawal delirium: Secondary | ICD-10-CM | POA: Diagnosis not present

## 2014-05-22 DIAGNOSIS — K7460 Unspecified cirrhosis of liver: Secondary | ICD-10-CM

## 2014-05-22 DIAGNOSIS — R531 Weakness: Secondary | ICD-10-CM

## 2014-05-22 DIAGNOSIS — F10232 Alcohol dependence with withdrawal with perceptual disturbance: Secondary | ICD-10-CM | POA: Diagnosis not present

## 2014-05-22 DIAGNOSIS — Z803 Family history of malignant neoplasm of breast: Secondary | ICD-10-CM | POA: Diagnosis not present

## 2014-05-22 DIAGNOSIS — K769 Liver disease, unspecified: Secondary | ICD-10-CM

## 2014-05-22 DIAGNOSIS — K7030 Alcoholic cirrhosis of liver without ascites: Secondary | ICD-10-CM | POA: Diagnosis not present

## 2014-05-22 DIAGNOSIS — R768 Other specified abnormal immunological findings in serum: Secondary | ICD-10-CM | POA: Insufficient documentation

## 2014-05-22 HISTORY — DX: Other specified postprocedural states: R11.2

## 2014-05-22 HISTORY — DX: Other specified postprocedural states: Z98.890

## 2014-05-22 LAB — RAPID URINE DRUG SCREEN, HOSP PERFORMED
Amphetamines: NOT DETECTED
BARBITURATES: NOT DETECTED
Benzodiazepines: POSITIVE — AB
Cocaine: NOT DETECTED
OPIATES: POSITIVE — AB
Tetrahydrocannabinol: NOT DETECTED

## 2014-05-22 LAB — COMPREHENSIVE METABOLIC PANEL
ALT: 34 U/L (ref 0–53)
ANION GAP: 5 (ref 5–15)
AST: 36 U/L (ref 0–37)
Albumin: 2.5 g/dL — ABNORMAL LOW (ref 3.5–5.2)
Alkaline Phosphatase: 90 U/L (ref 39–117)
BUN: 10 mg/dL (ref 6–23)
CHLORIDE: 101 mmol/L (ref 96–112)
CO2: 26 mmol/L (ref 19–32)
CREATININE: 0.77 mg/dL (ref 0.50–1.35)
Calcium: 10.4 mg/dL (ref 8.4–10.5)
GFR calc Af Amer: >90 mL/min (ref >90)
GFR calc non Af Amer: >90 mL/min (ref >90)
Glucose, Bld: 169 mg/dL — ABNORMAL HIGH (ref 70–99)
POTASSIUM: 4.1 mmol/L (ref 3.5–5.1)
Sodium: 132 mmol/L — ABNORMAL LOW (ref 135–145)
TOTAL PROTEIN: 6.7 g/dL (ref 6.0–8.3)
Total Bilirubin: 1.3 mg/dL — ABNORMAL HIGH (ref 0.3–1.2)

## 2014-05-22 LAB — CBC
HCT: 40.4 % (ref 39.0–52.0)
Hemoglobin: 12.8 g/dL — ABNORMAL LOW (ref 13.0–17.0)
MCH: 31.5 pg (ref 26.0–34.0)
MCHC: 31.7 g/dL (ref 30.0–36.0)
MCV: 99.5 fL (ref 78.0–100.0)
PLATELETS: 88 10*3/uL — AB (ref 150–400)
RBC: 4.06 MIL/uL — ABNORMAL LOW (ref 4.22–5.81)
RDW: 13.9 % (ref 11.5–15.5)
WBC: 5.9 10*3/uL (ref 4.0–10.5)

## 2014-05-22 LAB — ACETAMINOPHEN LEVEL

## 2014-05-22 LAB — AMMONIA: AMMONIA: 43 umol/L — AB (ref 11–32)

## 2014-05-22 LAB — ETHANOL: Alcohol, Ethyl (B): <5 mg/dL (ref 0–9)

## 2014-05-22 LAB — SALICYLATE LEVEL

## 2014-05-22 MED ORDER — TRAMADOL HCL 50 MG PO TABS: 50.0000 mg | ORAL_TABLET | Freq: Four times a day (QID) | ORAL | Status: DC | PRN

## 2014-05-22 MED ORDER — THIAMINE HCL 100 MG/ML IJ SOLN
Freq: Once | INTRAVENOUS | Status: AC
  Administered 2014-05-22: 17:00:00 via INTRAVENOUS
  Filled 2014-05-22: qty 1000

## 2014-05-22 MED ORDER — ADULT MULTIVITAMIN W/MINERALS CH
1.0000 | ORAL_TABLET | Freq: Every day | ORAL | Status: DC
  Administered 2014-05-22 – 2014-06-07 (×17): 1 via ORAL
  Filled 2014-05-22 (×17): qty 1

## 2014-05-22 MED ORDER — ACETAMINOPHEN 325 MG PO TABS
650.0000 mg | ORAL_TABLET | Freq: Four times a day (QID) | ORAL | Status: DC | PRN
  Administered 2014-05-25 – 2014-05-27 (×4): 650 mg via ORAL
  Filled 2014-05-22 (×5): qty 2

## 2014-05-22 MED ORDER — LORAZEPAM 1 MG PO TABS
1.0000 mg | ORAL_TABLET | Freq: Four times a day (QID) | ORAL | Status: AC | PRN
  Administered 2014-05-24 – 2014-05-25 (×2): 1 mg via ORAL
  Filled 2014-05-22 (×2): qty 1

## 2014-05-22 MED ORDER — SODIUM CHLORIDE 0.9 % IJ SOLN
3.0000 mL | Freq: Two times a day (BID) | INTRAMUSCULAR | Status: DC
  Administered 2014-05-31 – 2014-06-07 (×9): 3 mL via INTRAVENOUS

## 2014-05-22 MED ORDER — VITAMIN B-1 100 MG PO TABS
100.0000 mg | ORAL_TABLET | Freq: Every day | ORAL | Status: DC
  Administered 2014-05-22 – 2014-05-23 (×2): 100 mg via ORAL
  Filled 2014-05-22 (×2): qty 1

## 2014-05-22 MED ORDER — SODIUM CHLORIDE 0.9 % IV SOLN
INTRAVENOUS | Status: DC
  Administered 2014-05-23: 04:00:00 via INTRAVENOUS

## 2014-05-22 MED ORDER — LORAZEPAM 2 MG/ML IJ SOLN
1.0000 mg | Freq: Four times a day (QID) | INTRAMUSCULAR | Status: DC | PRN
  Administered 2014-05-23 – 2014-05-24 (×4): 1 mg via INTRAVENOUS
  Filled 2014-05-22 (×4): qty 1

## 2014-05-22 MED ORDER — ACETAMINOPHEN 650 MG RE SUPP: 650.0000 mg | Freq: Four times a day (QID) | RECTAL | Status: DC | PRN

## 2014-05-22 MED ORDER — ONDANSETRON HCL 4 MG/2ML IJ SOLN
4.0000 mg | Freq: Four times a day (QID) | INTRAMUSCULAR | Status: DC | PRN
Start: 2014-05-22 — End: 2014-06-07
  Administered 2014-05-24 – 2014-06-01 (×2): 4 mg via INTRAVENOUS
  Filled 2014-05-22 (×2): qty 2

## 2014-05-22 MED ORDER — THIAMINE HCL 100 MG/ML IJ SOLN
100.0000 mg | Freq: Every day | INTRAMUSCULAR | Status: DC
  Filled 2014-05-22: qty 1

## 2014-05-22 MED ORDER — TRAMADOL HCL 50 MG PO TABS
50.0000 mg | ORAL_TABLET | Freq: Four times a day (QID) | ORAL | Status: DC | PRN
  Administered 2014-05-22 – 2014-05-23 (×4): 50 mg via ORAL
  Filled 2014-05-22 (×4): qty 1

## 2014-05-22 MED ORDER — ONDANSETRON HCL 4 MG PO TABS: 4.0000 mg | ORAL_TABLET | Freq: Four times a day (QID) | ORAL | Status: DC | PRN

## 2014-05-22 MED ORDER — FOLIC ACID 1 MG PO TABS
1.0000 mg | ORAL_TABLET | Freq: Every day | ORAL | Status: DC
  Administered 2014-05-22 – 2014-06-07 (×17): 1 mg via ORAL
  Filled 2014-05-22 (×17): qty 1

## 2014-05-22 NOTE — ED Notes (Signed)
Hospitalist at bedside 

## 2014-05-22 NOTE — ED Notes (Signed)
Pt was at [facility] for an evaluation to possibly be admitted there.  Pt was not able to follow commands enough to participate in an evaluation.  [name] states that mental status change has been x 3 days.  Etoh and opiate use.

## 2014-05-22 NOTE — Progress Notes (Signed)
CSW met with Thomas Navarro at bedside. Wife was present. Wife informed CSW that the Thomas Navarro was informed to come to [facility] by staff at [facility] due to the Thomas Navarro having slurred speech and delayed comprehension. Patient states that he recognized his slurred speech and delayed comprehension about 4 days ago.   Wife states that the Thomas Navarro and her live in [place]. Patient and wife state that while at home the Thomas Navarro can complete his own ADL's independently. He states that he is not interested in going to a ALF. Patient is determined that he can manage his care at home. However, he and wife state that he is still interested in [facility].  Wife states that the Thomas Navarro will not be able to participate in the detox program at [facility] until he is medically cleared. Wife informed CSW that [facility] anticipates that the Thomas Navarro will be back in a couple of days if he is medically clear.   Patient states that he has slipped down the stairs within the past 6 months. He states it was due to something being in the floor or either him tripping.  Patient states that he does not have any questions at this time. However, he states that his back is in pain.    , LCSWA [phone] ED CSW 05/22/2014 6:13 PM      

## 2014-05-22 NOTE — ED Provider Notes (Signed)
CSN: [id number]     Arrival date & time 05/22/14  1246 History   First MD Initiated Contact with Patient 05/22/14 1318     Chief Complaint  Patient presents with  . Altered Mental Status  . Detox      (Consider location/radiation/quality/duration/timing/severity/associated sxs/prior Treatment) Patient is a 64 y.o. male presenting with altered mental status. The history is provided by the spouse (the pts wife states the pt has been confused for 3-4 days.  he was sent over from [facility] for confusion.  pt abuses alcohol for years).  Altered Mental Status Presenting symptoms: behavior changes   Severity:  Moderate Most recent episode:  More than 2 days ago Episode history:  Continuous Timing:  Constant Progression:  Unchanged Chronicity:  New Context: alcohol use   Associated symptoms: no abdominal pain, no hallucinations, no headaches, no rash and no seizures     Past Medical History  Diagnosis Date  . Chronic back pain    Past Surgical History  Procedure Laterality Date  . Back surgery     History reviewed. No pertinent family history. History  Substance Use Topics  . Smoking status: Never Smoker   . Smokeless tobacco: Never Used  . Alcohol Use: Not on file     Comment: occasional beer    Review of Systems  Constitutional: Negative for appetite change and fatigue.  HENT: Negative for congestion, ear discharge and sinus pressure.   Eyes: Negative for discharge.  Respiratory: Negative for cough.   Cardiovascular: Negative for chest pain.  Gastrointestinal: Negative for abdominal pain and diarrhea.  Genitourinary: Negative for frequency and hematuria.  Musculoskeletal: Negative for back pain.  Skin: Negative for rash.  Neurological: Negative for seizures and headaches.       Confusion  Psychiatric/Behavioral: Negative for hallucinations.      Allergies  Morphine and related  Home Medications   Prior to Admission medications   Medication Sig Start  Date End Date Taking? Authorizing Provider  diazepam (VALIUM) 5 MG tablet Take 1 tablet by mouth 3 (three) times daily as needed for anxiety.  03/12/13  Yes Historical Provider, MD  loperamide (IMODIUM A-D) 2 MG tablet Take 2 mg by mouth 4 (four) times daily as needed for diarrhea or loose stools.   Yes Historical Provider, MD  Oxycodone HCl 10 MG TABS Take 5-10 mg by mouth 3 (three) times daily. Take up to 3 times a day for back pain   Yes Historical Provider, MD  cephALEXin (KEFLEX) 500 MG capsule Take 1 capsule (500 mg total) by mouth 4 (four) times daily. Patient not taking: Reported on 05/22/2014 11/21/13   Montine Circle, PA-C  oxyCODONE (ROXICODONE) 5 MG immediate release tablet Take 1-2 tablets (5-10 mg total) by mouth every 6 (six) hours as needed for severe pain. Patient not taking: Reported on 05/22/2014 03/13/13   Virgel Manifold, MD   BP 134/64 mmHg  Pulse 84  Temp(Src) 97.9 F (36.6 C) (Oral)  Resp 16  SpO2 91% Physical Exam  Constitutional: He appears well-developed.  HENT:  Head: Normocephalic.  Eyes: Conjunctivae and EOM are normal. No scleral icterus.  Neck: Neck supple. No thyromegaly present.  Cardiovascular: Normal rate and regular rhythm.  Exam reveals no gallop and no friction rub.   No murmur heard. Pulmonary/Chest: No stridor. He has no wheezes. He has no rales. He exhibits no tenderness.  Abdominal: He exhibits no distension. There is no tenderness. There is no rebound.  Musculoskeletal: Normal range of motion. He  exhibits no edema.  Lymphadenopathy:    He has no cervical adenopathy.  Neurological: He is alert. He has normal reflexes. He exhibits normal muscle tone. Coordination normal.  Pt oriented to person, place but not time, mildly ataxic   Skin: No rash noted. No erythema.  Psychiatric: He has a normal mood and affect. His behavior is normal.    ED Course  Procedures (including critical care time) Labs Review Labs Reviewed  ACETAMINOPHEN LEVEL -  Abnormal; Notable for the following:    Acetaminophen (Tylenol), Serum <10.0 (*)    All other components within normal limits  CBC - Abnormal; Notable for the following:    RBC 4.06 (*)    Hemoglobin 12.8 (*)    Platelets 88 (*)    All other components within normal limits  COMPREHENSIVE METABOLIC PANEL - Abnormal; Notable for the following:    Sodium 132 (*)    Glucose, Bld 169 (*)    Albumin 2.5 (*)    Total Bilirubin 1.3 (*)    All other components within normal limits  URINE RAPID DRUG SCREEN (HOSP PERFORMED) - Abnormal; Notable for the following:    Opiates POSITIVE (*)    Benzodiazepines POSITIVE (*)    All other components within normal limits  AMMONIA - Abnormal; Notable for the following:    Ammonia 43 (*)    All other components within normal limits  ETHANOL  SALICYLATE LEVEL    Imaging Review Ct Head Wo Contrast  05/22/2014   CLINICAL DATA:  64 year old male with altered mental status and confusion for 1 week. Initial encounter.  EXAM: CT HEAD WITHOUT CONTRAST  TECHNIQUE: Contiguous axial images were obtained from the base of the skull through the vertex without intravenous contrast.  COMPARISON:  None.  FINDINGS: Visualized paranasal sinuses and mastoids are clear. No acute osseous abnormality identified. Visualized orbits and scalp soft tissues are within normal limits. Calcified atherosclerosis at the skull base.  Cerebral volume is within normal limits for age. No midline shift, ventriculomegaly, mass effect, evidence of mass lesion, intracranial hemorrhage or evidence of cortically based acute infarction. Gray-white matter differentiation is within normal limits throughout the brain. No suspicious intracranial vascular hyperdensity.  IMPRESSION: Normal for age non contrast CT appearance of the brain.   Electronically Signed   By: Genevie Ann M.D.   On: 05/22/2014 14:28   Dg Chest Port 1 View  05/22/2014   CLINICAL DATA:  Weakness today, chronic back pain  EXAM: PORTABLE  CHEST - 1 VIEW  COMPARISON:  Portable exam 1425 hours without priors for comparison  FINDINGS: Enlargement of cardiac silhouette with pulmonary vascular congestion, likely accentuated by hypoinflation and technique.  Decreased lung volumes with minimal RIGHT basilar atelectasis.  Minimal central peribronchial thickening.  No acute infiltrate, pleural effusion or pneumothorax.  IMPRESSION: Minimal bronchitic changes and RIGHT basilar atelectasis.   Electronically Signed   By: Lavonia Dana M.D.   On: 05/22/2014 14:48     EKG Interpretation   Date/Time:  Wednesday May 22 2014 13:52:09 EDT Ventricular Rate:  87 PR Interval:  134 QRS Duration: 134 QT Interval:  367 QTC Calculation: 441 R Axis:   -8 Text Interpretation:  Sinus rhythm Right bundle branch block Confirmed by  Keilon Ressel  MD, Broadus John [phone]) on 05/22/2014 3:19:19 PM      MDM   Final diagnoses:  Weakness  Disorientation    Confusion, possibly do to medicine will admit    Milton Ferguson, MD 05/22/14 1528

## 2014-05-22 NOTE — Progress Notes (Signed)
Clinical Social Work Department BRIEF PSYCHOSOCIAL ASSESSMENT 05/22/2014  Patient:  Thomas Navarro, Thomas Navarro     Account Number:  [id number]     Admit date:  05/22/2014  Clinical Social Worker:  Tilda Burrow, CLINICAL SOCIAL WORKER  Date/Time:  05/22/2014 10:27 AM  Referred by:  CSW  Date Referred:  05/22/2014 Referred for  Other - See comment   Other Referral:   Wife states that the pt wishes to participate in the detox program at [facility].   Interview type:  Patient Other interview type:   Wife also particpated in [place].    PSYCHOSOCIAL DATA Living Status:  WIFE Admitted from facility:   Level of care:   Primary support name:  Dena Billet Primary support relationship to patient:  SPOUSE Degree of support available:   Wife states that she is the pt's priamary support.    Patient informed CSW that he lives at home with with wife.    CURRENT CONCERNS Current Concerns  Adjustment to Illness   Other Concerns:   Patient also has issues with Etoh and opiate use. He would like to be admitted to [facility].    SOCIAL WORK ASSESSMENT / PLAN CSW met with pt at bedside. Wife was present. Wife informed CSW that the pt was informed to come to [facility] by staff at [facility] due to the pt having slurred speech and delayed comprehension. Patient states that he recognized his slurred speech and delayed comprehension about 4 days ago.    Wife states that the pt and her live in [place]. Patient and wife state that while at home the pt can complete his own ADL's independently. He states that he is not interested in going to a ALF. Patient is determined that he can manage his care at home. However, he and wife state that he is still interested in [facility].    Wife states that the pt will not be able to participate in the detox program at [facility] until he is medically cleared. Wife informed CSW that [facility] anticipates that the pt will be back in a couple of  days if he is medically clear.    Patient states that he has slipped down the stairs within the past 6 months. He states it was due to something being in the floor or either him tripping.    Patient states that he does not have any questions at this time. However, he states that his back is in pain.   Assessment/plan status:  Information/Referral to [employer] Other assessment/ plan:   Patient states that he would like to go to [facility] upon discharge.   Information/referral to community resources:    PATIENT'S/FAMILY'S RESPONSE TO PLAN OF CARE: Wife states that [facility] called her today to check on the pt. She states that the pt will not be able to participate in the program unless he is medically clear.       Willette Brace [phone] ED CSW 05/22/2014 10:45 PM

## 2014-05-22 NOTE — H&P (Signed)
[facility] History and Physical  Thomas Navarro JQZ:[id number] DOB: June 07, 1950 DOA: 05/22/2014  Referring physician:  PCP: No PCP Per Patient   Chief Complaint: Mental status changes  HPI: Thomas Navarro is a 64 y.o. male with a past medical history of chronic back pain on narcotic analgesics for the past year, alcohol abuse, presenting to the emergency department with complaints of mental status changes. His wife who is present at bedside reporting patient having mental status changes over the past 4-5 days, characterized by patient responding inappropriately to questions, having confusion and disorientation, difficulties following instructions and "not acting himself." His wife reporting that his prescription for oxycodone 10 mg was written for 3 times a day when necessary, however he may have been taking 4-5 tablets on a daily basis. He has also continued to drink over the course of the week, having his last beer yesterday evening. Today he attempted to check himself into [facility] alcohol rehabilitation facility, however he was referred to the emergency room at [facility] for further evaluation of mental status changes. In the emergency room patient reporting pain and asking for oxycodone. He is confused about the amount of alcohol he drinks or the amount of medication he takes. Workup in the emergency room included a CT scan of brain which was negative.                                                     Review of Systems:  Difficult to obtain reliable review of systems given patient's encephalopathy  Past Medical History  Diagnosis Date  . Chronic back pain   . PONV (postoperative nausea and vomiting)    Past Surgical History  Procedure Laterality Date  . Back surgery     Social History:  reports that he has never smoked. He has never used smokeless tobacco. He reports that he drinks about 3.0 oz of alcohol per week. He reports that he uses illicit drugs  (Oxycodone) about 28 times per week.  Allergies  Allergen Reactions  . Morphine And Related Nausea And Vomiting    History reviewed. No pertinent family history. Patient encephalopathic, poor historian  Prior to Admission medications   Medication Sig Start Date End Date Taking? Authorizing Provider  diazepam (VALIUM) 5 MG tablet Take 1 tablet by mouth 3 (three) times daily as needed for anxiety.  03/12/13  Yes Historical Provider, MD  loperamide (IMODIUM A-D) 2 MG tablet Take 2 mg by mouth 4 (four) times daily as needed for diarrhea or loose stools.   Yes Historical Provider, MD  Oxycodone HCl 10 MG TABS Take 5-10 mg by mouth 3 (three) times daily. Take up to 3 times a day for back pain   Yes Historical Provider, MD  cephALEXin (KEFLEX) 500 MG capsule Take 1 capsule (500 mg total) by mouth 4 (four) times daily. Patient not taking: Reported on 05/22/2014 11/21/13   Montine Circle, PA-C  oxyCODONE (ROXICODONE) 5 MG immediate release tablet Take 1-2 tablets (5-10 mg total) by mouth every 6 (six) hours as needed for severe pain. Patient not taking: Reported on 05/22/2014 03/13/13   Virgel Manifold, MD   Physical Exam: Filed Vitals:   05/22/14 1341 05/22/14 1341 05/22/14 1500 05/22/14 1555  BP: 129/50 129/50 134/64 126/72  Pulse: 87 87 84 91  Temp:  98.3 F (36.8 C)  TempSrc:    Oral  Resp: 16  16 20   SpO2: 92%  91% 91%    Wt Readings from Last 3 Encounters:  11/21/13 95.255 kg (210 lb)    General:  He doesn't appear to be in acute distress, however is confused and disoriented, having difficulties answering questions, could not tell me where he was. Does not appear to have tremors during my evaluation. Eyes: PERRL, normal lids, irises & conjunctiva ENT: grossly normal hearing, lips & tongue Neck: no LAD, masses or thyromegaly Cardiovascular: RRR, no m/r/g. No LE edema. Telemetry: SR, no arrhythmias  Respiratory: CTA bilaterally, no w/r/r. Normal respiratory effort. Abdomen: soft,  ntnd Skin: no rash or induration seen on limited exam Musculoskeletal: grossly normal tone BUE/BLE Psychiatric: Patient is awake and alert, having trouble tell me where he was, could identify city county and state. Had difficulties with calculation, was able to follow simple commands for me Neurologic: grossly non-focal.          Labs on Admission:  Basic Metabolic Panel:  Recent Labs Lab 05/22/14 1313  NA 132*  K 4.1  CL 101  CO2 26  GLUCOSE 169*  BUN 10  CREATININE 0.77  CALCIUM 10.4   Liver Function Tests:  Recent Labs Lab 05/22/14 1313  AST 36  ALT 34  ALKPHOS 90  BILITOT 1.3*  PROT 6.7  ALBUMIN 2.5*   No results for input(s): LIPASE, AMYLASE in the last 168 hours.  Recent Labs Lab 05/22/14 1316  AMMONIA 43*   CBC:  Recent Labs Lab 05/22/14 1313  WBC 5.9  HGB 12.8*  HCT 40.4  MCV 99.5  PLT 88*   Cardiac Enzymes: No results for input(s): CKTOTAL, CKMB, CKMBINDEX, TROPONINI in the last 168 hours.  BNP (last 3 results) No results for input(s): BNP in the last 8760 hours.  ProBNP (last 3 results) No results for input(s): PROBNP in the last 8760 hours.  CBG: No results for input(s): GLUCAP in the last 168 hours.  Radiological Exams on Admission: Ct Head Wo Contrast  05/22/2014   CLINICAL DATA:  64 year old male with altered mental status and confusion for 1 week. Initial encounter.  EXAM: CT HEAD WITHOUT CONTRAST  TECHNIQUE: Contiguous axial images were obtained from the base of the skull through the vertex without intravenous contrast.  COMPARISON:  None.  FINDINGS: Visualized paranasal sinuses and mastoids are clear. No acute osseous abnormality identified. Visualized orbits and scalp soft tissues are within normal limits. Calcified atherosclerosis at the skull base.  Cerebral volume is within normal limits for age. No midline shift, ventriculomegaly, mass effect, evidence of mass lesion, intracranial hemorrhage or evidence of cortically based acute  infarction. Gray-white matter differentiation is within normal limits throughout the brain. No suspicious intracranial vascular hyperdensity.  IMPRESSION: Normal for age non contrast CT appearance of the brain.   Electronically Signed   By: Genevie Ann M.D.   On: 05/22/2014 14:28   Dg Chest Port 1 View  05/22/2014   CLINICAL DATA:  Weakness today, chronic back pain  EXAM: PORTABLE CHEST - 1 VIEW  COMPARISON:  Portable exam 1425 hours without priors for comparison  FINDINGS: Enlargement of cardiac silhouette with pulmonary vascular congestion, likely accentuated by hypoinflation and technique.  Decreased lung volumes with minimal RIGHT basilar atelectasis.  Minimal central peribronchial thickening.  No acute infiltrate, pleural effusion or pneumothorax.  IMPRESSION: Minimal bronchitic changes and RIGHT basilar atelectasis.   Electronically Signed   By: Elta Guadeloupe  Thornton Papas M.D.   On: 05/22/2014 14:48    EKG: Independently reviewed.   Assessment/Plan Principal Problem:   Acute encephalopathy Active Problems:   Chronic back pain   Alcohol abuse   Alcohol withdrawal   Hyponatremia   Thrombocytopenia   1. Acute encephalopathy. Patient presenting with increased confusion, disorientation, with his wife reporting that he likely is taking greater amounts of oxycodone than what he is actually prescribed. This may be true of his Valium. Over the course of the week he has continued to drink on a daily basis. I suspect encephalopathy is secondary to a combination of alcohol and psychotropic medications. Workup in the emergency room included a CT scan of brain which did not show acute intracranial abnormalities. Will try to minimize polypharmacy, admitted to telemetry, treat pain with tramadol. Place patient on alcohol withdrawal protocol. 2. Alcohol abuse. Patient with history of tobacco abuse having his last beer yesterday evening. He had an undetectable alcohol level in the emergency room. Will place patient under CIWA  Protocol treat with IV Ativan. Will provide thiamine, folate, multivitamin, IV fluid resuscitation. Admit him to telemetry, monitor closely for signs and symptoms consistent with alcohol withdrawal. 3. Thrombocytopenia. Lab work showing a platelet count of 88,000, had been 42,000 and October 2015. Likely related to alcoholic liver disease. Holding pharmacologic DVT prophylaxis. 4. Hyponatremia. Labs showing sodium level 132, likely secondary to dehydration. Providing IV fluid resuscitation 5. Probable alcoholic liver disease. Patient presenting with thrombocytopenia, having abdominal ultrasound performed on 04/13/2012 that showed findings consistent with cirrhosis. Radiology had also mentioned a 1.8 cm indeterminate hypoechoic lesion in the posterior right hepatic lobe. Will repeat right upper quadrant ultrasound at this time to assess these lesions. 6. DVT prophylaxis. SCDs    Code Status: Full code Family Communication: Spoke to his wife was present at bedside Disposition Plan: Will admit patient to the inpatient service, to you require greater than 2 nights hospitalization  Time spent: 65 min  Kelvin Cellar [facility] Pager [phone]

## 2014-05-22 NOTE — ED Notes (Signed)
Pt brought in for altered mental status x 1 week, family member states pt has been confused, will answer questions inconsistently, has poor sense of time, and has difficulty following simple instructions; states pt has Hx of the same when he is intoxicated. Pt states he took 1 oxycodone hcl 10 mg today, denies use of ETOH today, pt's family member states patient may have taken more oxycodone and valium, and may have had alcohol today. Family member states patient at times is dishonest and/or confused about how much alcohol and medication he has taken. Pt visited [facility] today to initiate ETOH and opiate detox, [place] sent pt here due to altered mental status.

## 2014-05-23 ENCOUNTER — Inpatient Hospital Stay (HOSPITAL_COMMUNITY): Payer: BLUE CROSS/BLUE SHIELD

## 2014-05-23 LAB — URINALYSIS, ROUTINE W REFLEX MICROSCOPIC
Glucose, UA: NEGATIVE mg/dL
Hgb urine dipstick: NEGATIVE
Ketones, ur: NEGATIVE mg/dL
Leukocytes, UA: NEGATIVE
NITRITE: NEGATIVE
Protein, ur: NEGATIVE mg/dL
SPECIFIC GRAVITY, URINE: 1.02 (ref 1.005–1.030)
UROBILINOGEN UA: 2 mg/dL — AB (ref 0.0–1.0)
pH: 6 (ref 5.0–8.0)

## 2014-05-23 LAB — CBC
HCT: 36.5 % — ABNORMAL LOW (ref 39.0–52.0)
HCT: 36.2 % — ABNORMAL LOW (ref 39.0–52.0)
HEMOGLOBIN: 11.6 g/dL — AB (ref 13.0–17.0)
Hemoglobin: 11.6 g/dL — ABNORMAL LOW (ref 13.0–17.0)
MCH: 31.5 pg (ref 26.0–34.0)
MCH: 31.9 pg (ref 26.0–34.0)
MCHC: 31.8 g/dL (ref 30.0–36.0)
MCHC: 32 g/dL (ref 30.0–36.0)
MCV: 99.2 fL (ref 78.0–100.0)
MCV: 99.5 fL (ref 78.0–100.0)
PLATELETS: 65 10*3/uL — AB (ref 150–400)
Platelets: 67 10*3/uL — ABNORMAL LOW (ref 150–400)
RBC: 3.68 MIL/uL — AB (ref 4.22–5.81)
RBC: 3.64 MIL/uL — AB (ref 4.22–5.81)
RDW: 13.8 % (ref 11.5–15.5)
RDW: 13.6 % (ref 11.5–15.5)
WBC: 4.2 10*3/uL (ref 4.0–10.5)
WBC: 5.2 10*3/uL (ref 4.0–10.5)

## 2014-05-23 LAB — VITAMIN B12: VITAMIN B 12: 1598 pg/mL — AB (ref 211–911)

## 2014-05-23 LAB — COMPREHENSIVE METABOLIC PANEL
ALT: 31 U/L (ref 0–53)
AST: 33 U/L (ref 0–37)
Albumin: 2.1 g/dL — ABNORMAL LOW (ref 3.5–5.2)
Alkaline Phosphatase: 76 U/L (ref 39–117)
Anion gap: 3 — ABNORMAL LOW (ref 5–15)
BUN: 8 mg/dL (ref 6–23)
CHLORIDE: 105 mmol/L (ref 96–112)
CO2: 25 mmol/L (ref 19–32)
Calcium: 10.1 mg/dL (ref 8.4–10.5)
Creatinine, Ser: 0.73 mg/dL (ref 0.50–1.35)
Glucose, Bld: 127 mg/dL — ABNORMAL HIGH (ref 70–99)
POTASSIUM: 4 mmol/L (ref 3.5–5.1)
Sodium: 133 mmol/L — ABNORMAL LOW (ref 135–145)
Total Bilirubin: 1 mg/dL (ref 0.3–1.2)
Total Protein: 5.8 g/dL — ABNORMAL LOW (ref 6.0–8.3)

## 2014-05-23 LAB — CALCIUM, IONIZED: Calcium, Ion: 1.59 mmol/L — ABNORMAL HIGH (ref 1.12–1.32)

## 2014-05-23 LAB — TSH: TSH: 1.435 u[IU]/mL (ref 0.350–4.500)

## 2014-05-23 MED ORDER — THIAMINE HCL 100 MG/ML IJ SOLN: 100.0000 mg | Freq: Every day | INTRAMUSCULAR | Status: DC

## 2014-05-23 MED ORDER — THIAMINE HCL 100 MG/ML IJ SOLN
500.0000 mg | Freq: Three times a day (TID) | INTRAVENOUS | Status: AC
  Administered 2014-05-23 – 2014-05-25 (×6): 500 mg via INTRAVENOUS
  Filled 2014-05-23 (×8): qty 5

## 2014-05-23 MED ORDER — OXYCODONE HCL 5 MG PO TABS
5.0000 mg | ORAL_TABLET | Freq: Three times a day (TID) | ORAL | Status: DC | PRN
  Administered 2014-05-23 – 2014-05-29 (×15): 5 mg via ORAL
  Filled 2014-05-23 (×17): qty 1

## 2014-05-23 NOTE — Progress Notes (Signed)
CSW spoke with [name], Thomas Navarro, with permission from Pt, about their attempts to have Pt admitted to [facility] for substance abuse treatment. [name] provided CSW with contact information for admissions counselor at [facility]. CSW to follow-up with [facility] tomorrow to help facilitate admissions process. [name] appreciative of CSW.  Peri Maris, [place] 05/23/2014 4:26 PM [phone]

## 2014-05-23 NOTE — Progress Notes (Signed)
PROGRESS NOTE  [name] VCB:[id number] DOB: May 13, 1950 DOA: 05/22/2014 PCP: No PCP Per Patient  Brief history 64 year old male with a history of chronic back pain on chronic opioids, alcohol dependence, alcoholic liver disease presents with 4-5 day history of worsening confusion. Apparently, the patient drinks approximately 1/5 of liquor every week as well as beer on a daily basis. However, he does not consider beer alcohol infrequently minimizes his use. The patient was at [facility] on 05/22/2014. Because of his medical instability, the patient was sent to the emergency department. There is been no reports of chest pain, shortness of breath, vomiting, dysuria, hematuria, hematochezia, melena. The patient's wife states that the patient has been using more oxycodone than prescribed.  Assessment/Plan: Acute encephalopathy -Multifactorial including combination of alcohol and narcotic overuse, possible Wernicke encephalopathy, hypercalcemia, hyponatremia  -Check serum B12, RBC folate  -Check TSH  -Check HIV, RPR  -Urinalysis --negative for pyuria  -Urine drug screen is negative  -Alcohol level negative at the time of admission  -thiamine 500mg  IV q 8 hour -ammonia 43 Hypercalcemia  -IV fluids  -Intact PTH  -Wife states that the patient had a screening colonoscopy through 5-10 years ago which was "negative"  Liver lesion -previous CT's of abdomen with contrast suggested hemangioma -check AFP -discussed with radiology, Dr. Lemmie Evens. [facility]--strongly recommended MRI of liver be done as outpt -wife updated on situation Thrombocytopenia  -Secondary to alcoholic liver disease  -Continue to monitor  Hyponatremia  -likely a combination of volume depletion in setting of cirrhosis Alcohol dependence  -Alcohol withdrawal protocol  Chronic back pain  -d/c tramadol -judicious opioids   Family Communication:   Wife updated at beside--total time 35 minutes. >50% spent counseling  patient and coordinating care Disposition Plan:   Home when medically stable       Procedures/Studies: Ct Head Wo Contrast  05/22/2014   CLINICAL DATA:  64 year old male with altered mental status and confusion for 1 week. Initial encounter.  EXAM: CT HEAD WITHOUT CONTRAST  TECHNIQUE: Contiguous axial images were obtained from the base of the skull through the vertex without intravenous contrast.  COMPARISON:  None.  FINDINGS: Visualized paranasal sinuses and mastoids are clear. No acute osseous abnormality identified. Visualized orbits and scalp soft tissues are within normal limits. Calcified atherosclerosis at the skull base.  Cerebral volume is within normal limits for age. No midline shift, ventriculomegaly, mass effect, evidence of mass lesion, intracranial hemorrhage or evidence of cortically based acute infarction. Gray-white matter differentiation is within normal limits throughout the brain. No suspicious intracranial vascular hyperdensity.  IMPRESSION: Normal for age non contrast CT appearance of the brain.   Electronically Signed   By: Genevie Ann M.D.   On: 05/22/2014 14:28   Dg Chest Port 1 View  05/22/2014   CLINICAL DATA:  Weakness today, chronic back pain  EXAM: PORTABLE CHEST - 1 VIEW  COMPARISON:  Portable exam 1425 hours without priors for comparison  FINDINGS: Enlargement of cardiac silhouette with pulmonary vascular congestion, likely accentuated by hypoinflation and technique.  Decreased lung volumes with minimal RIGHT basilar atelectasis.  Minimal central peribronchial thickening.  No acute infiltrate, pleural effusion or pneumothorax.  IMPRESSION: Minimal bronchitic changes and RIGHT basilar atelectasis.   Electronically Signed   By: Lavonia Dana M.D.   On: 05/22/2014 14:48   US Abdomen Limited Ruq  05/23/2014   CLINICAL DATA:  64 year old male with cirrhosis and solitary liver lesion. Subsequent encounter.  EXAM: US ABDOMEN LIMITED - RIGHT UPPER QUADRANT  COMPARISON:  CT Abdomen  and Pelvis 03/13/2013.  FINDINGS: Gallbladder:  No gallstones identified. No sonographic Murphy sign noted. Gallbladder wall thickness at the upper limits of normal, 3 mm. No pericholecystic fluid.  Common bile duct:  Diameter: 3 mm, normal  Liver: Mildly diffuse increased liver echo. Nodular superficial liver contour. Vague hypoechoic nodule identified on image 42 measuring 10 mm diameter. Larger more complex lesion on image 47 measuring up to 1.6 cm. This appears to be in the left lobe.  Two mostly hyperechoic lesions then are identified in the right lobe, 1 near the gallbladder fossa measures 2.9 cm diameter on image 65. A second lesion on image 71 measures 3 cm diameter.  No intrahepatic biliary ductal dilatation.  Other findings: No ascites identified. Negative visible right kidney  IMPRESSION: 1. Cirrhosis with multiple 1.5-3 cm diameter indeterminate liver lesions identified. Outpatient follow-up Abdomen MRI without and with contrast (Liver protocol) is indicated to further characterize, but should be deferred until after discharge from the hospital to facilitate optimal imaging quality. 2. Negative gallbladder.  No biliary obstruction.   Electronically Signed   By: Genevie Ann M.D.   On: 05/23/2014 08:47         Subjective: Patient is pleasantly confused but denies any fevers, chills, chest pain, shortness breath, nausea, vomiting, diarrhea, abdominal pain. No dysuria or hematuria. No hematochezia or melena.   Objective: Filed Vitals:   05/22/14 1555 05/22/14 1712 05/22/14 2122 05/23/14 0521  BP: 126/72 141/68 152/74 142/77  Pulse: 91 93 92 90  Temp: 98.3 F (36.8 C) 97.7 F (36.5 C) 98.1 F (36.7 C) 98.5 F (36.9 C)  TempSrc: Oral Oral Oral Oral  Resp: 20 20 22 16   Height:  5\' 11"  (1.803 m)    Weight:  97.523 kg (215 lb)    SpO2: 91% 94% 93% 93%    Intake/Output Summary (Last 24 hours) at 05/23/14 1410 Last data filed at 05/23/14 0502  Gross per 24 hour  Intake 1241.67 ml  Output       0 ml  Net 1241.67 ml   Weight change:  Exam:   General:  Pt is alert, follows commands appropriately, not in acute distress  HEENT: No icterus, No thrush, No meningismus,  Morris/AT  Cardiovascular: RRR, S1/S2, no rubs, no gallops  Respiratory: Diminished breath sounds but clear to auscultation.   Abdomen: Soft/+BS, non tender, non distended, no guarding  Extremities: No edema, No lymphangitis, No petechiae, No rashes, no synovitis  Data Reviewed: Basic Metabolic Panel:  Recent Labs Lab 05/22/14 1313 05/23/14 0540  NA 132* 133*  K 4.1 4.0  CL 101 105  CO2 26 25  GLUCOSE 169* 127*  BUN 10 8  CREATININE 0.77 0.73  CALCIUM 10.4 10.1   Liver Function Tests:  Recent Labs Lab 05/22/14 1313 05/23/14 0540  AST 36 33  ALT 34 31  ALKPHOS 90 76  BILITOT 1.3* 1.0  PROT 6.7 5.8*  ALBUMIN 2.5* 2.1*   No results for input(s): LIPASE, AMYLASE in the last 168 hours.  Recent Labs Lab 05/22/14 1316  AMMONIA 43*   CBC:  Recent Labs Lab 05/22/14 1313 05/23/14 0540  WBC 5.9 4.2  HGB 12.8* 11.6*  HCT 40.4 36.5*  MCV 99.5 99.2  PLT 88* 65*   Cardiac Enzymes: No results for input(s): CKTOTAL, CKMB, CKMBINDEX, TROPONINI in the last 168 hours. BNP: Invalid input(s): POCBNP CBG: No results for input(s): GLUCAP in the  last 168 hours.  No results found for this or any previous visit (from the past 240 hour(s)).   Scheduled Meds: . folic acid  1 mg Oral Daily  . multivitamin with minerals  1 tablet Oral Daily  . sodium chloride  3 mL Intravenous Q12H  . thiamine  100 mg Oral Daily   Or  . thiamine  100 mg Intravenous Daily   Continuous Infusions: . sodium chloride 100 mL/hr at 05/23/14 0425     Thomas Weekly, DO  [facility] Pager [phone]  If 7PM-7AM, please contact night-coverage [email or web address] Password [email or web address] 05/23/2014, 2:10 PM   LOS: 1 day

## 2014-05-23 NOTE — Progress Notes (Signed)
UR complete 

## 2014-05-24 LAB — COMPREHENSIVE METABOLIC PANEL
ALT: 34 U/L (ref 0–53)
ANION GAP: 3 — AB (ref 5–15)
AST: 35 U/L (ref 0–37)
Albumin: 2.3 g/dL — ABNORMAL LOW (ref 3.5–5.2)
Alkaline Phosphatase: 85 U/L (ref 39–117)
BUN: 7 mg/dL (ref 6–23)
CHLORIDE: 106 mmol/L (ref 96–112)
CO2: 24 mmol/L (ref 19–32)
CREATININE: 0.7 mg/dL (ref 0.50–1.35)
Calcium: 10.2 mg/dL (ref 8.4–10.5)
GFR calc Af Amer: >90 mL/min (ref >90)
GFR calc non Af Amer: >90 mL/min (ref >90)
Glucose, Bld: 140 mg/dL — ABNORMAL HIGH (ref 70–99)
Potassium: 4 mmol/L (ref 3.5–5.1)
Sodium: 133 mmol/L — ABNORMAL LOW (ref 135–145)
Total Bilirubin: 1.3 mg/dL — ABNORMAL HIGH (ref 0.3–1.2)
Total Protein: 5.9 g/dL — ABNORMAL LOW (ref 6.0–8.3)

## 2014-05-24 LAB — URINE CULTURE: Colony Count: 7000

## 2014-05-24 LAB — HIV ANTIBODY (ROUTINE TESTING W REFLEX): HIV Screen 4th Generation wRfx: NONREACTIVE

## 2014-05-24 LAB — FOLATE RBC
FOLATE, RBC: 1144 ng/mL (ref >498)
Folate, Hemolysate: 426.7 ng/mL
HEMATOCRIT: 37.3 % — AB (ref 37.5–51.0)

## 2014-05-24 LAB — PARATHYROID HORMONE, INTACT (NO CA): PTH: 28 pg/mL (ref 15–65)

## 2014-05-24 LAB — RPR: RPR: NONREACTIVE

## 2014-05-24 MED ORDER — LORAZEPAM 2 MG/ML IJ SOLN
2.0000 mg | INTRAMUSCULAR | Status: DC | PRN
  Administered 2014-05-24 – 2014-05-29 (×18): 2 mg via INTRAVENOUS
  Filled 2014-05-24 (×20): qty 1

## 2014-05-24 MED ORDER — SODIUM CHLORIDE 0.9 % IV SOLN
INTRAVENOUS | Status: DC
  Administered 2014-05-24 (×2): via INTRAVENOUS

## 2014-05-24 NOTE — Progress Notes (Signed)
CSW attempted to call [facility] admissions staff to discuss Pt's case and facilitate possible admission to their facility when medically stable. CSW will continue to follow-up.  Peri Maris, LCSWA 05/24/2014 4:30 PM [phone]

## 2014-05-24 NOTE — Progress Notes (Signed)
PROGRESS NOTE  Thomas Navarro:[id number] DOB: [birth year] DOA: 05/22/2014 PCP: No PCP Per Patient  Assessment/Plan: Acute encephalopathy -Multifactorial including combination of alcohol and narcotic overuse, possible Wernicke encephalopathy, hypercalcemia, hyponatremia , and Etoh withdraw -Check serum B12, RBC folate--unremarkable  -Check TSH--1.435 -Check HIV, RPR--neg -Urinalysis --negative for pyuria  -Urine drug screen is negative  -Alcohol level negative at the time of admission  -thiamine 500mg  IV q 8 hour -ammonia 43 Alcohol dependence/Alcohol withdraw -Alcohol withdrawal protocol  - increased Ativan to 2 mg IV every 4 hours Hypercalcemia  -Continue IV fluids  -Intact PTH--28  -Wife states that the patient had a screening colonoscopy through 5-10 years ago which was "negative"  -check urine calcium/creatinine ratio Liver lesion -previous CT's of abdomen with contrast suggested hemangioma -check AFP--pending -discussed with radiology, Dr. Lemmie Evens. [place]--strongly recommended MRI of liver be done as outpt -wife updated on situation Thrombocytopenia  -Secondary to alcoholic liver disease  -Continue to monitor  Hyponatremia  -likely a combination of volume depletion in setting of cirrhosis  Chronic back pain  -d/c tramadol -judicious opioids  mechanical fall - 05/24/2014-- patient had mechanical fall complains of left hip pain and left ankle pain - X-rays of the left hip and left ankle  Family Communication:   No family at beside Disposition Plan:   [facility] when medically stable       Procedures/Studies: Ct Head Wo Contrast  05/22/2014   CLINICAL DATA:  64 year old male with altered mental status and confusion for 1 week. Initial encounter.  EXAM: CT HEAD WITHOUT CONTRAST  TECHNIQUE: Contiguous axial images were obtained from the base of the skull through the vertex without intravenous contrast.  COMPARISON:  None.  FINDINGS:  Visualized paranasal sinuses and mastoids are clear. No acute osseous abnormality identified. Visualized orbits and scalp soft tissues are within normal limits. Calcified atherosclerosis at the skull base.  Cerebral volume is within normal limits for age. No midline shift, ventriculomegaly, mass effect, evidence of mass lesion, intracranial hemorrhage or evidence of cortically based acute infarction. Gray-white matter differentiation is within normal limits throughout the brain. No suspicious intracranial vascular hyperdensity.  IMPRESSION: Normal for age non contrast CT appearance of the brain.   Electronically Signed   By: Genevie Ann M.D.   On: 05/22/2014 14:28   Dg Chest Port 1 View  05/22/2014   CLINICAL DATA:  Weakness today, chronic back pain  EXAM: PORTABLE CHEST - 1 VIEW  COMPARISON:  Portable exam 1425 hours without priors for comparison  FINDINGS: Enlargement of cardiac silhouette with pulmonary vascular congestion, likely accentuated by hypoinflation and technique.  Decreased lung volumes with minimal RIGHT basilar atelectasis.  Minimal central peribronchial thickening.  No acute infiltrate, pleural effusion or pneumothorax.  IMPRESSION: Minimal bronchitic changes and RIGHT basilar atelectasis.   Electronically Signed   By: Lavonia Dana M.D.   On: 05/22/2014 14:48   US Abdomen Limited Ruq  05/23/2014   CLINICAL DATA:  64 year old male with cirrhosis and solitary liver lesion. Subsequent encounter.  EXAM: US ABDOMEN LIMITED - RIGHT UPPER QUADRANT  COMPARISON:  CT Abdomen and Pelvis 03/13/2013.  FINDINGS: Gallbladder:  No gallstones identified. No sonographic Murphy sign noted. Gallbladder wall thickness at the upper limits of normal, 3 mm. No pericholecystic fluid.  Common bile duct:  Diameter: 3 mm, normal  Liver: Mildly diffuse increased liver echo. Nodular superficial liver contour. Vague hypoechoic nodule identified on image 42 measuring 10 mm diameter. Larger  more complex lesion on image 47  measuring up to 1.6 cm. This appears to be in the left lobe.  Two mostly hyperechoic lesions then are identified in the right lobe, 1 near the gallbladder fossa measures 2.9 cm diameter on image 65. A second lesion on image 71 measures 3 cm diameter.  No intrahepatic biliary ductal dilatation.  Other findings: No ascites identified. Negative visible right kidney  IMPRESSION: 1. Cirrhosis with multiple 1.5-3 cm diameter indeterminate liver lesions identified. Outpatient follow-up Abdomen MRI without and with contrast (Liver protocol) is indicated to further characterize, but should be deferred until after discharge from the hospital to facilitate optimal imaging quality. 2. Negative gallbladder.  No biliary obstruction.   Electronically Signed   By: Genevie Ann M.D.   On: 05/23/2014 08:47         Subjective:  patient is pleasant but confused. He becomes intermittently agitated. Denies any chest pain, shortness breath, vomiting, diarrhea,, pain.  Objective: Filed Vitals:   05/23/14 2039 05/24/14 0438 05/24/14 1328 05/24/14 1700  BP: 158/78 141/70 145/76 142/70  Pulse: 74 72 79 76  Temp: 98.2 F (36.8 C) 97.7 F (36.5 C) 98.1 F (36.7 C) 98.6 F (37 C)  TempSrc: Oral Oral Oral Oral  Resp: 20 18 20 21   Height:      Weight:      SpO2: 94% 97% 98% 95%    Intake/Output Summary (Last 24 hours) at 05/24/14 1854 Last data filed at 05/24/14 1300  Gross per 24 hour  Intake    480 ml  Output      0 ml  Net    480 ml   Weight change:  Exam:   General:  Pt is alert, follows commands appropriately, not in acute distress  HEENT: No icterus, No thrush, [place]/AT  Cardiovascular: RRR, S1/S2, no rubs, no gallops  Respiratory:  Diminished breath sounds but clear to auscultation. No wheezing.  Abdomen: Soft/+BS, non tender, non distended, no guarding  Extremities: No edema, No lymphangitis, No petechiae, No rashes, no synovitis; no pain, edema, erythema of the left ankle or left hip.  Data  Reviewed: Basic Metabolic Panel:  Recent Labs Lab 05/22/14 1313 05/23/14 0540 05/24/14 0550  NA 132* 133* 133*  K 4.1 4.0 4.0  CL 101 105 106  CO2 26 25 24   GLUCOSE 169* 127* 140*  BUN 10 8 7   CREATININE 0.77 0.73 0.70  CALCIUM 10.4 10.1 10.2   Liver Function Tests:  Recent Labs Lab 05/22/14 1313 05/23/14 0540 05/24/14 0550  AST 36 33 35  ALT 34 31 34  ALKPHOS 90 76 85  BILITOT 1.3* 1.0 1.3*  PROT 6.7 5.8* 5.9*  ALBUMIN 2.5* 2.1* 2.3*   No results for input(s): LIPASE, AMYLASE in the last 168 hours.  Recent Labs Lab 05/22/14 1316  AMMONIA 43*   CBC:  Recent Labs Lab 05/22/14 1313 05/23/14 0540 05/23/14 0930 05/23/14 1505  WBC 5.9 4.2  --  5.2  HGB 12.8* 11.6*  --  11.6*  HCT 40.4 36.5* 37.3* 36.2*  MCV 99.5 99.2  --  99.5  PLT 88* 65*  --  67*   Cardiac Enzymes: No results for input(s): CKTOTAL, CKMB, CKMBINDEX, TROPONINI in the last 168 hours. BNP: Invalid input(s): POCBNP CBG: No results for input(s): GLUCAP in the last 168 hours.  Recent Results (from the past 240 hour(s))  Urine culture     Status: None   Collection Time: 05/23/14  9:58 AM  Result Value Ref  Range Status   Specimen Description URINE, CLEAN CATCH  Final   Special Requests NONE  Final   Colony Count   Final    7,000 COLONIES/ML Performed at [facility]    Culture   Final    INSIGNIFICANT GROWTH Performed at [facility]    Report Status 05/24/2014 FINAL  Final     Scheduled Meds: . folic acid  1 mg Oral Daily  . multivitamin with minerals  1 tablet Oral Daily  . sodium chloride  3 mL Intravenous Q12H  . thiamine IV  500 mg Intravenous 3 times per day   Continuous Infusions: . sodium chloride 100 mL/hr at 05/23/14 0425     Glena Pharris, DO  [facility] Pager [phone]  If 7PM-7AM, please contact night-coverage [email or web address] Password [email or web address] 05/24/2014, 6:54 PM   LOS: 2 days

## 2014-05-24 NOTE — Progress Notes (Signed)
Patient observed on floor. No injuries except red area to left hip, abrasion to left knee. He denies hitting head. Nurse notified tat, d md. Wife mary [place] notified. Patient alert and oriented. Patient stated he hurts all the times. Patient unsure of why he fell.

## 2014-05-25 LAB — COMPREHENSIVE METABOLIC PANEL
ALK PHOS: 96 U/L (ref 39–117)
ALT: 41 U/L (ref 0–53)
ANION GAP: 2 — AB (ref 5–15)
AST: 47 U/L — ABNORMAL HIGH (ref 0–37)
Albumin: 2.3 g/dL — ABNORMAL LOW (ref 3.5–5.2)
BUN: 10 mg/dL (ref 6–23)
CO2: 27 mmol/L (ref 19–32)
CREATININE: 0.83 mg/dL (ref 0.50–1.35)
Calcium: 10.3 mg/dL (ref 8.4–10.5)
Chloride: 106 mmol/L (ref 96–112)
GFR calc non Af Amer: >90 mL/min (ref >90)
Glucose, Bld: 116 mg/dL — ABNORMAL HIGH (ref 70–99)
Potassium: 4 mmol/L (ref 3.5–5.1)
SODIUM: 135 mmol/L (ref 135–145)
Total Bilirubin: 1.7 mg/dL — ABNORMAL HIGH (ref 0.3–1.2)
Total Protein: 6.1 g/dL (ref 6.0–8.3)

## 2014-05-25 LAB — CALCIUM / CREATININE RATIO, URINE
Calcium, Ur: 14 mg/dL
Calcium/Creat.Ratio: 0.1
Creatinine, Urine: 121.1 mg/dL

## 2014-05-25 LAB — AFP TUMOR MARKER: AFP TUMOR MARKER: 3846 ng/mL — AB (ref 0.0–8.3)

## 2014-05-25 MED ORDER — LORAZEPAM 2 MG/ML IJ SOLN
2.0000 mg | Freq: Once | INTRAMUSCULAR | Status: AC
  Administered 2014-05-25: 2 mg via INTRAVENOUS
  Filled 2014-05-25: qty 1

## 2014-05-25 NOTE — Progress Notes (Signed)
PROGRESS NOTE  Thomas Navarro MOQ:[id number] DOB: 27-Apr-1950 DOA: 05/22/2014 PCP: No PCP Per Patient  Assessment/Plan: Acute encephalopathy -Multifactorial including combination of alcohol and narcotic overuse, possible Wernicke encephalopathy, hypercalcemia, hyponatremia , and Etoh withdraw -Check serum B12, RBC folate--unremarkable  -Check TSH--1.435 -Check HIV, RPR--neg -Urinalysis --negative for pyuria  -Urine drug screen is negative  -Alcohol level negative at the time of admission  -thiamine 500mg  IV q 8 hour x 2 days -ammonia 43 Alcohol dependence/Alcohol withdraw -Alcohol withdrawal protocol  - increased Ativan to 2 mg IV every 4 hours -05/25/14--having hallucinations, remains confused and intermittently aggitated Hypercalcemia  -Continue IV fluids  -Intact PTH--28  -Wife states that the patient had a screening colonoscopy through 5-10 years ago which was "negative"  -check urine calcium/creatinine ratio -25-vitamin D, 1,25 viatmin D Liver lesion -previous CT's of abdomen with contrast suggested hemangioma -check AFP--3846-->concerned about hepatoma -discussed with wife, she is aware -discussed with radiology, Dr. Lemmie Evens. [place]--strongly recommended MRI of liver be done as outpt -pt too confused for MRI presently Thrombocytopenia  -Secondary to alcoholic liver disease  -Continue to monitor  Hyponatremia  -likely a combination of volume depletion in setting of cirrhosis -improving with IVF Chronic back pain  -d/c tramadol -judicious opioids -short trial of muscle relaxer mechanical fall - 05/24/2014-- patient had mechanical fall complains of left hip pain and left ankle pain - 05/25/14--pain in L-hip and L- ankle resolved, able to ambulate  Family Communication: wife updated at beside Disposition Plan: [facility] when medically stable         Procedures/Studies: Ct Head Wo Contrast  05/22/2014   CLINICAL DATA:   64 year old male with altered mental status and confusion for 1 week. Initial encounter.  EXAM: CT HEAD WITHOUT CONTRAST  TECHNIQUE: Contiguous axial images were obtained from the base of the skull through the vertex without intravenous contrast.  COMPARISON:  None.  FINDINGS: Visualized paranasal sinuses and mastoids are clear. No acute osseous abnormality identified. Visualized orbits and scalp soft tissues are within normal limits. Calcified atherosclerosis at the skull base.  Cerebral volume is within normal limits for age. No midline shift, ventriculomegaly, mass effect, evidence of mass lesion, intracranial hemorrhage or evidence of cortically based acute infarction. Gray-white matter differentiation is within normal limits throughout the brain. No suspicious intracranial vascular hyperdensity.  IMPRESSION: Normal for age non contrast CT appearance of the brain.   Electronically Signed   By: Genevie Ann M.D.   On: 05/22/2014 14:28   Dg Chest Port 1 View  05/22/2014   CLINICAL DATA:  Weakness today, chronic back pain  EXAM: PORTABLE CHEST - 1 VIEW  COMPARISON:  Portable exam 1425 hours without priors for comparison  FINDINGS: Enlargement of cardiac silhouette with pulmonary vascular congestion, likely accentuated by hypoinflation and technique.  Decreased lung volumes with minimal RIGHT basilar atelectasis.  Minimal central peribronchial thickening.  No acute infiltrate, pleural effusion or pneumothorax.  IMPRESSION: Minimal bronchitic changes and RIGHT basilar atelectasis.   Electronically Signed   By: Lavonia Dana M.D.   On: 05/22/2014 14:48   US Abdomen Limited Ruq  05/23/2014   CLINICAL DATA:  64 year old male with cirrhosis and solitary liver lesion. Subsequent encounter.  EXAM: US ABDOMEN LIMITED - RIGHT UPPER QUADRANT  COMPARISON:  CT Abdomen and Pelvis 03/13/2013.  FINDINGS: Gallbladder:  No gallstones identified. No sonographic Murphy sign noted. Gallbladder wall thickness at the upper limits of  normal, 3 mm. No pericholecystic fluid.  Common bile duct:  Diameter: 3 mm, normal  Liver: Mildly diffuse increased liver echo. Nodular superficial liver contour. Vague hypoechoic nodule identified on image 42 measuring 10 mm diameter. Larger more complex lesion on image 47 measuring up to 1.6 cm. This appears to be in the left lobe.  Two mostly hyperechoic lesions then are identified in the right lobe, 1 near the gallbladder fossa measures 2.9 cm diameter on image 65. A second lesion on image 71 measures 3 cm diameter.  No intrahepatic biliary ductal dilatation.  Other findings: No ascites identified. Negative visible right kidney  IMPRESSION: 1. Cirrhosis with multiple 1.5-3 cm diameter indeterminate liver lesions identified. Outpatient follow-up Abdomen MRI without and with contrast (Liver protocol) is indicated to further characterize, but should be deferred until after discharge from the hospital to facilitate optimal imaging quality. 2. Negative gallbladder.  No biliary obstruction.   Electronically Signed   By: Genevie Ann M.D.   On: 05/23/2014 08:47         Subjective: Patient remains confused. He is having hallucinations. He is intermittently agitated. He denies any chest pain, shortness breath, vomiting, diarrhea, dysuria, hematuria. No rashes. No headache.  Objective: Filed Vitals:   05/24/14 2129 05/25/14 0516 05/25/14 1638 05/25/14 1744  BP: 150/72 154/75 151/74 155/75  Pulse: 72 73 74 72  Temp: 98.1 F (36.7 C) 98 F (36.7 C) 98.2 F (36.8 C)   TempSrc: Oral Oral Oral   Resp: 20 18 18    Height:      Weight:      SpO2: 94% 94% 100%     Intake/Output Summary (Last 24 hours) at 05/25/14 1811 Last data filed at 05/25/14 0559  Gross per 24 hour  Intake 988.33 ml  Output    150 ml  Net 838.33 ml   Weight change:  Exam:   General:  Pt is alert, follows commands appropriately but confused, not in acute distress  HEENT: No icterus, No thrush, No meningismus,  [place]/AT  Cardiovascular: RRR, S1/S2, no rubs, no gallops  Respiratory: CTA bilaterally, no wheezing, no crackles, no rhonchi  Abdomen: Soft/+BS, non tender, non distended, no guarding  Extremities: No edema, No lymphangitis, No petechiae, No rashes, no synovitis  Data Reviewed: Basic Metabolic Panel:  Recent Labs Lab 05/22/14 1313 05/23/14 0540 05/24/14 0550 05/25/14 0544  NA 132* 133* 133* 135  K 4.1 4.0 4.0 4.0  CL 101 105 106 106  CO2 26 25 24 27   GLUCOSE 169* 127* 140* 116*  BUN 10 8 7 10   CREATININE 0.77 0.73 0.70 0.83  CALCIUM 10.4 10.1 10.2 10.3   Liver Function Tests:  Recent Labs Lab 05/22/14 1313 05/23/14 0540 05/24/14 0550 05/25/14 0544  AST 36 33 35 47*  ALT 34 31 34 41  ALKPHOS 90 76 85 96  BILITOT 1.3* 1.0 1.3* 1.7*  PROT 6.7 5.8* 5.9* 6.1  ALBUMIN 2.5* 2.1* 2.3* 2.3*   No results for input(s): LIPASE, AMYLASE in the last 168 hours.  Recent Labs Lab 05/22/14 1316  AMMONIA 43*   CBC:  Recent Labs Lab 05/22/14 1313 05/23/14 0540 05/23/14 0930 05/23/14 1505  WBC 5.9 4.2  --  5.2  HGB 12.8* 11.6*  --  11.6*  HCT 40.4 36.5* 37.3* 36.2*  MCV 99.5 99.2  --  99.5  PLT 88* 65*  --  67*   Cardiac Enzymes: No results for input(s): CKTOTAL, CKMB, CKMBINDEX, TROPONINI in the last 168 hours. BNP: Invalid input(s): POCBNP CBG: No results for input(s):  GLUCAP in the last 168 hours.  Recent Results (from the past 240 hour(s))  Urine culture     Status: None   Collection Time: 05/23/14  9:58 AM  Result Value Ref Range Status   Specimen Description URINE, CLEAN CATCH  Final   Special Requests NONE  Final   Colony Count   Final    7,000 COLONIES/ML Performed at [facility]    Culture   Final    INSIGNIFICANT GROWTH Performed at [facility]    Report Status 05/24/2014 FINAL  Final     Scheduled Meds: . folic acid  1 mg Oral Daily  . LORazepam  2 mg Intravenous Once  . multivitamin with minerals  1 tablet Oral Daily   . sodium chloride  3 mL Intravenous Q12H   Continuous Infusions: . sodium chloride 100 mL/hr at 05/24/14 2120     Eran Windish, DO  [facility] Pager [phone]  If 7PM-7AM, please contact night-coverage [email or web address] Password [email or web address] 05/25/2014, 6:11 PM   LOS: 3 days

## 2014-05-26 LAB — COMPREHENSIVE METABOLIC PANEL
ALT: 63 U/L — AB (ref 0–53)
AST: 73 U/L — ABNORMAL HIGH (ref 0–37)
Albumin: 2.3 g/dL — ABNORMAL LOW (ref 3.5–5.2)
Alkaline Phosphatase: 121 U/L — ABNORMAL HIGH (ref 39–117)
Anion gap: 2 — ABNORMAL LOW (ref 5–15)
BILIRUBIN TOTAL: 2.5 mg/dL — AB (ref 0.3–1.2)
BUN: 10 mg/dL (ref 6–23)
CALCIUM: 10.1 mg/dL (ref 8.4–10.5)
CHLORIDE: 108 mmol/L (ref 96–112)
CO2: 25 mmol/L (ref 19–32)
Creatinine, Ser: 0.74 mg/dL (ref 0.50–1.35)
Glucose, Bld: 110 mg/dL — ABNORMAL HIGH (ref 70–99)
Potassium: 3.9 mmol/L (ref 3.5–5.1)
SODIUM: 135 mmol/L (ref 135–145)
Total Protein: 5.8 g/dL — ABNORMAL LOW (ref 6.0–8.3)

## 2014-05-26 MED ORDER — VITAMIN B-1 100 MG PO TABS
100.0000 mg | ORAL_TABLET | Freq: Every day | ORAL | Status: DC
  Administered 2014-05-26 – 2014-05-27 (×2): 100 mg via ORAL
  Filled 2014-05-26 (×2): qty 1

## 2014-05-26 MED ORDER — SODIUM CHLORIDE 0.9 % IV SOLN
INTRAVENOUS | Status: DC
  Administered 2014-05-26 – 2014-05-29 (×4): via INTRAVENOUS

## 2014-05-26 NOTE — Progress Notes (Signed)
PROGRESS NOTE  Thomas Navarro QRF:[id number] DOB: Jun 22, 1950 DOA: 05/22/2014 PCP: No PCP Per Patient  Assessment/Plan: Acute encephalopathy -Multifactorial including combination of alcohol and narcotic overuse, possible Wernicke encephalopathy, hypercalcemia, hyponatremia , and Etoh withdraw -Check serum B12, RBC folate--unremarkable  -Check TSH--1.435 -Check HIV, RPR--neg -Urinalysis --negative for pyuria  -Urine drug screen is negative  -Alcohol level negative at the time of admission  -thiamine 500mg  IV q 8 hour x 2 days -ammonia 43 Alcohol dependence/Alcohol withdraw -Alcohol withdrawal protocol  - increased Ativan to 2 mg IV every 4 hours -05/25/14--having hallucinations, remains confused and intermittently aggitated -05/26/2014--less agitated, but remains confused Hypercalcemia  -Continue IV fluids  -Intact PTH--28  -Wife states that the patient had a screening colonoscopy through 5-10 years ago which was "negative"  -check urine calcium/creatinine ratio--0.01-->may have [place] -25-vitamin D, 1,25 viatmin D--pending Liver lesion--concerning for hepatoma -previous CT's of abdomen with contrast suggested hemangioma -check AFP--3846-->concerned about hepatoma -discussed with wife, she is aware -discussed with radiology, Dr. Lemmie Evens. [place]--strongly recommended MRI of liver be done as outpt -pt too confused for MRI presently Thrombocytopenia  -Secondary to alcoholic liver disease  -Continue to monitor  Hyponatremia  -likely a combination of volume depletion in setting of cirrhosis -improving with IVF Chronic back pain  -d/c tramadol -judicious opioids -short trial of muscle relaxer mechanical fall - 05/24/2014-- patient had mechanical fall complains of left hip pain and left ankle pain - 05/25/14--pain in L-hip and L- ankle resolved, able to ambulate  Family Communication: wife updated at beside Disposition Plan: [facility] when medically  stable       Procedures/Studies: Ct Head Wo Contrast  05/22/2014   CLINICAL DATA:  64 year old male with altered mental status and confusion for 1 week. Initial encounter.  EXAM: CT HEAD WITHOUT CONTRAST  TECHNIQUE: Contiguous axial images were obtained from the base of the skull through the vertex without intravenous contrast.  COMPARISON:  None.  FINDINGS: Visualized paranasal sinuses and mastoids are clear. No acute osseous abnormality identified. Visualized orbits and scalp soft tissues are within normal limits. Calcified atherosclerosis at the skull base.  Cerebral volume is within normal limits for age. No midline shift, ventriculomegaly, mass effect, evidence of mass lesion, intracranial hemorrhage or evidence of cortically based acute infarction. Gray-white matter differentiation is within normal limits throughout the brain. No suspicious intracranial vascular hyperdensity.  IMPRESSION: Normal for age non contrast CT appearance of the brain.   Electronically Signed   By: Genevie Ann M.D.   On: 05/22/2014 14:28   Dg Chest Port 1 View  05/22/2014   CLINICAL DATA:  Weakness today, chronic back pain  EXAM: PORTABLE CHEST - 1 VIEW  COMPARISON:  Portable exam 1425 hours without priors for comparison  FINDINGS: Enlargement of cardiac silhouette with pulmonary vascular congestion, likely accentuated by hypoinflation and technique.  Decreased lung volumes with minimal RIGHT basilar atelectasis.  Minimal central peribronchial thickening.  No acute infiltrate, pleural effusion or pneumothorax.  IMPRESSION: Minimal bronchitic changes and RIGHT basilar atelectasis.   Electronically Signed   By: Lavonia Dana M.D.   On: 05/22/2014 14:48   US Abdomen Limited Ruq  05/23/2014   CLINICAL DATA:  64 year old male with cirrhosis and solitary liver lesion. Subsequent encounter.  EXAM: US ABDOMEN LIMITED - RIGHT UPPER QUADRANT  COMPARISON:  CT Abdomen and Pelvis 03/13/2013.  FINDINGS: Gallbladder:  No gallstones  identified. No sonographic Murphy sign noted. Gallbladder wall thickness at the upper limits  of normal, 3 mm. No pericholecystic fluid.  Common bile duct:  Diameter: 3 mm, normal  Liver: Mildly diffuse increased liver echo. Nodular superficial liver contour. Vague hypoechoic nodule identified on image 42 measuring 10 mm diameter. Larger more complex lesion on image 47 measuring up to 1.6 cm. This appears to be in the left lobe.  Two mostly hyperechoic lesions then are identified in the right lobe, 1 near the gallbladder fossa measures 2.9 cm diameter on image 65. A second lesion on image 71 measures 3 cm diameter.  No intrahepatic biliary ductal dilatation.  Other findings: No ascites identified. Negative visible right kidney  IMPRESSION: 1. Cirrhosis with multiple 1.5-3 cm diameter indeterminate liver lesions identified. Outpatient follow-up Abdomen MRI without and with contrast (Liver protocol) is indicated to further characterize, but should be deferred until after discharge from the hospital to facilitate optimal imaging quality. 2. Negative gallbladder.  No biliary obstruction.   Electronically Signed   By: Genevie Ann M.D.   On: 05/23/2014 08:47         Subjective: Patient is pleasant but confused. He denies any chest pain, shortness breath, coughing, hemoptysis, vomiting, diarrhea, abdominal pain.  Objective: Filed Vitals:   05/26/14 0115 05/26/14 0645 05/26/14 1507 05/26/14 1725  BP: 162/64 142/67 153/79 156/72  Pulse: 74 73 68 68  Temp: 97.5 F (36.4 C) 98.3 F (36.8 C) 98.4 F (36.9 C) 97.7 F (36.5 C)  TempSrc: Oral Oral Oral Oral  Resp: 20 20 18 18   Height:      Weight:      SpO2: 98% 90% 94% 96%    Intake/Output Summary (Last 24 hours) at 05/26/14 1812 Last data filed at 05/26/14 1330  Gross per 24 hour  Intake   1630 ml  Output      0 ml  Net   1630 ml   Weight change:  Exam:   General:  Pt is alert, follows commands appropriately, not in acute distress  HEENT: No  icterus, No thrush, No meningismus, [place]/AT  Cardiovascular: RRR, S1/S2, no rubs, no gallops  Respiratory: CTA bilaterally, no wheezing, no crackles, no rhonchi  Abdomen: Soft/+BS, non tender, non distended, no guarding  Extremities: No edema, No lymphangitis, No petechiae, No rashes, no synovitis  Data Reviewed: Basic Metabolic Panel:  Recent Labs Lab 05/22/14 1313 05/23/14 0540 05/24/14 0550 05/25/14 0544 05/26/14 0534  NA 132* 133* 133* 135 135  K 4.1 4.0 4.0 4.0 3.9  CL 101 105 106 106 108  CO2 26 25 24 27 25   GLUCOSE 169* 127* 140* 116* 110*  BUN 10 8 7 10 10   CREATININE 0.77 0.73 0.70 0.83 0.74  CALCIUM 10.4 10.1 10.2 10.3 10.1   Liver Function Tests:  Recent Labs Lab 05/22/14 1313 05/23/14 0540 05/24/14 0550 05/25/14 0544 05/26/14 0534  AST 36 33 35 47* 73*  ALT 34 31 34 41 63*  ALKPHOS 90 76 85 96 121*  BILITOT 1.3* 1.0 1.3* 1.7* 2.5*  PROT 6.7 5.8* 5.9* 6.1 5.8*  ALBUMIN 2.5* 2.1* 2.3* 2.3* 2.3*   No results for input(s): LIPASE, AMYLASE in the last 168 hours.  Recent Labs Lab 05/22/14 1316  AMMONIA 43*   CBC:  Recent Labs Lab 05/22/14 1313 05/23/14 0540 05/23/14 0930 05/23/14 1505  WBC 5.9 4.2  --  5.2  HGB 12.8* 11.6*  --  11.6*  HCT 40.4 36.5* 37.3* 36.2*  MCV 99.5 99.2  --  99.5  PLT 88* 65*  --  67*  Cardiac Enzymes: No results for input(s): CKTOTAL, CKMB, CKMBINDEX, TROPONINI in the last 168 hours. BNP: Invalid input(s): POCBNP CBG: No results for input(s): GLUCAP in the last 168 hours.  Recent Results (from the past 240 hour(s))  Urine culture     Status: None   Collection Time: 05/23/14  9:58 AM  Result Value Ref Range Status   Specimen Description URINE, CLEAN CATCH  Final   Special Requests NONE  Final   Colony Count   Final    7,000 COLONIES/ML Performed at [facility]    Culture   Final    INSIGNIFICANT GROWTH Performed at [facility]    Report Status 05/24/2014 FINAL  Final     Scheduled  Meds: . folic acid  1 mg Oral Daily  . multivitamin with minerals  1 tablet Oral Daily  . sodium chloride  3 mL Intravenous Q12H  . thiamine  100 mg Oral Daily   Continuous Infusions: . sodium chloride 100 mL/hr at 05/24/14 2120     Rosealynn Mateus, DO  [facility] Pager [phone]  If 7PM-7AM, please contact night-coverage [email or web address] Password [email or web address] 05/26/2014, 6:12 PM   LOS: 4 days

## 2014-05-27 DIAGNOSIS — E512 Wernicke's encephalopathy: Principal | ICD-10-CM

## 2014-05-27 LAB — VITAMIN D 25 HYDROXY (VIT D DEFICIENCY, FRACTURES): Vit D, 25-Hydroxy: 37.7 ng/mL (ref 30.0–100.0)

## 2014-05-27 LAB — CBC
HEMATOCRIT: 36.5 % — AB (ref 39.0–52.0)
Hemoglobin: 11.6 g/dL — ABNORMAL LOW (ref 13.0–17.0)
MCH: 31.7 pg (ref 26.0–34.0)
MCHC: 31.8 g/dL (ref 30.0–36.0)
MCV: 99.7 fL (ref 78.0–100.0)
PLATELETS: 75 10*3/uL — AB (ref 150–400)
RBC: 3.66 MIL/uL — ABNORMAL LOW (ref 4.22–5.81)
RDW: 13.9 % (ref 11.5–15.5)
WBC: 4.8 10*3/uL (ref 4.0–10.5)

## 2014-05-27 LAB — COMPREHENSIVE METABOLIC PANEL
ALT: 58 U/L — ABNORMAL HIGH (ref 0–53)
ANION GAP: 3 — AB (ref 5–15)
AST: 65 U/L — AB (ref 0–37)
Albumin: 2.1 g/dL — ABNORMAL LOW (ref 3.5–5.2)
Alkaline Phosphatase: 115 U/L (ref 39–117)
BILIRUBIN TOTAL: 2 mg/dL — AB (ref 0.3–1.2)
BUN: 9 mg/dL (ref 6–23)
CO2: 23 mmol/L (ref 19–32)
Calcium: 9.5 mg/dL (ref 8.4–10.5)
Chloride: 106 mmol/L (ref 96–112)
Creatinine, Ser: 0.68 mg/dL (ref 0.50–1.35)
GFR calc Af Amer: >90 mL/min (ref >90)
Glucose, Bld: 119 mg/dL — ABNORMAL HIGH (ref 70–99)
Potassium: 3.8 mmol/L (ref 3.5–5.1)
Sodium: 132 mmol/L — ABNORMAL LOW (ref 135–145)
Total Protein: 5.6 g/dL — ABNORMAL LOW (ref 6.0–8.3)

## 2014-05-27 MED ORDER — THIAMINE HCL 100 MG/ML IJ SOLN
500.0000 mg | INTRAVENOUS | Status: DC
  Administered 2014-05-27: 500 mg via INTRAVENOUS
  Filled 2014-05-27: qty 5

## 2014-05-27 NOTE — Clinical Social Work Note (Signed)
CSW met with patient for follow up in regards to plans for [facility] inpatient treatment at d/c. Patient reports, "I want to talk to my wife about this". Patient shared with CSW that he has not been to [facility] but is interested in getting help. I spoke to wife and she is contacting [facility] to discuss his admission- concerned about him getting pain meds here and the ability for him to go there off of any narcotics. She will clarify and CSW will investigate as well.   Eduard Clos, MSW, [place]

## 2014-05-27 NOTE — Progress Notes (Signed)
PROGRESS NOTE  Thomas Navarro NHA:[id number] DOB: 03-27-1950 DOA: 05/22/2014 PCP: No PCP Per Patient  Brief history 64 year old male with a history of chronic back pain on chronic opioids, alcohol dependence, alcoholic liver disease presents with 4-5 day history of worsening confusion. Apparently, the patient drinks approximately 1/5 of liquor every week as well as beer on a daily basis. However, he does not consider beer alcohol infrequently minimizes his use. The patient was at [facility] on 05/22/2014. Because of his medical instability, the patient was sent to the emergency department. There is been no reports of chest pain, shortness of breath, vomiting, dysuria, hematuria, hematochezia, melena. The patient's wife states that the patient has been using more oxycodone than prescribed.  Assessment/Plan: Acute encephalopathy--suspect Wernicke's Encephalopathy -Multifactorial including combination of alcohol and narcotic overuse, possible Wernicke encephalopathy, hypercalcemia, hyponatremia , and Etoh withdraw -Check serum B12, RBC folate--unremarkable  -Check TSH--1.435 -Check HIV, RPR--neg -Urinalysis --negative for pyuria  -Urine drug screen is negative  -Alcohol level negative at the time of admission  -thiamine 500mg  IV q 8 hour x 2 days--then 500mg  daily-->not much improvement -ammonia 43 -MRI brain if pt is able to tolerate Alcohol dependence/Alcohol withdraw -Alcohol withdrawal protocol  - increased Ativan to 2 mg IV every 4 hours -05/25/14--having hallucinations, remains confused and intermittently aggitated -05/27/2014--less agitated, but remains confused Hypercalcemia  -Continue IV fluids --> improving -Intact PTH--28  -Wife states that the patient had a screening colonoscopy through 5-10 years ago which was "negative"  -check urine calcium/creatinine ratio--0.01-->may have [place] -25-vitamin D--37.7 -1,25 viatmin D--pending Liver lesion--concerning  for hepatoma -previous CT's of abdomen with contrast suggested hemangioma -check AFP--3846-->concerned about hepatoma -discussed with wife, she is aware -discussed with radiology, Dr. Lemmie Evens. [place]--strongly recommended MRI of liver be done as outpt -pt too confused for MRI liver  presently Thrombocytopenia  -Secondary to alcoholic liver disease  -Continue to monitor  Hyponatremia  -likely a combination of volume depletion in setting of cirrhosis  Chronic back pain  -d/c tramadol -judicious opioids -short trial of muscle relaxer--seems to be less complaining of pain mechanical fall - 05/24/2014-- patient had mechanical fall complains of left hip pain and left ankle pain - 05/25/14--pain in L-hip and L- ankle resolved, able to ambulate  Family Communication: wife updated on phone 4/18 Disposition Plan: [facility] when medically stable       Procedures/Studies: Ct Head Wo Contrast  05/22/2014   CLINICAL DATA:  64 year old male with altered mental status and confusion for 1 week. Initial encounter.  EXAM: CT HEAD WITHOUT CONTRAST  TECHNIQUE: Contiguous axial images were obtained from the base of the skull through the vertex without intravenous contrast.  COMPARISON:  None.  FINDINGS: Visualized paranasal sinuses and mastoids are clear. No acute osseous abnormality identified. Visualized orbits and scalp soft tissues are within normal limits. Calcified atherosclerosis at the skull base.  Cerebral volume is within normal limits for age. No midline shift, ventriculomegaly, mass effect, evidence of mass lesion, intracranial hemorrhage or evidence of cortically based acute infarction. Gray-white matter differentiation is within normal limits throughout the brain. No suspicious intracranial vascular hyperdensity.  IMPRESSION: Normal for age non contrast CT appearance of the brain.   Electronically Signed   By: Genevie Ann M.D.   On: 05/22/2014 14:28   Dg Chest Port 1 View  05/22/2014    CLINICAL DATA:  Weakness today, chronic back pain  EXAM: PORTABLE CHEST - 1 VIEW  COMPARISON:  Portable exam 1425 hours without priors for comparison  FINDINGS: Enlargement of cardiac silhouette with pulmonary vascular congestion, likely accentuated by hypoinflation and technique.  Decreased lung volumes with minimal RIGHT basilar atelectasis.  Minimal central peribronchial thickening.  No acute infiltrate, pleural effusion or pneumothorax.  IMPRESSION: Minimal bronchitic changes and RIGHT basilar atelectasis.   Electronically Signed   By: Lavonia Dana M.D.   On: 05/22/2014 14:48   US Abdomen Limited Ruq  05/23/2014   CLINICAL DATA:  64 year old male with cirrhosis and solitary liver lesion. Subsequent encounter.  EXAM: US ABDOMEN LIMITED - RIGHT UPPER QUADRANT  COMPARISON:  CT Abdomen and Pelvis 03/13/2013.  FINDINGS: Gallbladder:  No gallstones identified. No sonographic Murphy sign noted. Gallbladder wall thickness at the upper limits of normal, 3 mm. No pericholecystic fluid.  Common bile duct:  Diameter: 3 mm, normal  Liver: Mildly diffuse increased liver echo. Nodular superficial liver contour. Vague hypoechoic nodule identified on image 42 measuring 10 mm diameter. Larger more complex lesion on image 47 measuring up to 1.6 cm. This appears to be in the left lobe.  Two mostly hyperechoic lesions then are identified in the right lobe, 1 near the gallbladder fossa measures 2.9 cm diameter on image 65. A second lesion on image 71 measures 3 cm diameter.  No intrahepatic biliary ductal dilatation.  Other findings: No ascites identified. Negative visible right kidney  IMPRESSION: 1. Cirrhosis with multiple 1.5-3 cm diameter indeterminate liver lesions identified. Outpatient follow-up Abdomen MRI without and with contrast (Liver protocol) is indicated to further characterize, but should be deferred until after discharge from the hospital to facilitate optimal imaging quality. 2. Negative gallbladder.  No biliary  obstruction.   Electronically Signed   By: Genevie Ann M.D.   On: 05/23/2014 08:47         Subjective: Patient denies fevers, chills, headache, chest pain, dyspnea, nausea, vomiting, diarrhea, abdominal pain, dysuria, hematuria   Objective: Filed Vitals:   05/26/14 1725 05/26/14 2133 05/27/14 0549 05/27/14 1340  BP: 156/72 159/60 150/63 149/66  Pulse: 68 85 85 80  Temp: 97.7 F (36.5 C) 99.6 F (37.6 C) 98.8 F (37.1 C) 98.1 F (36.7 C)  TempSrc: Oral Oral Oral Oral  Resp: 18 18 18 20   Height:      Weight:      SpO2: 96% 90% 92% 94%    Intake/Output Summary (Last 24 hours) at 05/27/14 1916 Last data filed at 05/27/14 1340  Gross per 24 hour  Intake   1690 ml  Output      0 ml  Net   1690 ml   Weight change:  Exam:   General:  Pt is alert, follows commands appropriately, not in acute distress  HEENT: No icterus, No thrush,  [place]/AT  Cardiovascular: RRR, S1/S2, no rubs, no gallops  Respiratory: Bibasilar rales. No wheeze. Good air movement.  Abdomen: Soft/+BS, non tender, non distended, no guarding  Extremities: 1+LE edema, No lymphangitis, No petechiae, No rashes, no synovitis  Data Reviewed: Basic Metabolic Panel:  Recent Labs Lab 05/23/14 0540 05/24/14 0550 05/25/14 0544 05/26/14 0534 05/27/14 0540  NA 133* 133* 135 135 132*  K 4.0 4.0 4.0 3.9 3.8  CL 105 106 106 108 106  CO2 25 24 27 25 23   GLUCOSE 127* 140* 116* 110* 119*  BUN 8 7 10 10 9   CREATININE 0.73 0.70 0.83 0.74 0.68  CALCIUM 10.1 10.2 10.3 10.1 9.5   Liver Function Tests:  Recent Labs Lab 05/23/14 0540  05/24/14 0550 05/25/14 0544 05/26/14 0534 05/27/14 0540  AST 33 35 47* 73* 65*  ALT 31 34 41 63* 58*  ALKPHOS 76 85 96 121* 115  BILITOT 1.0 1.3* 1.7* 2.5* 2.0*  PROT 5.8* 5.9* 6.1 5.8* 5.6*  ALBUMIN 2.1* 2.3* 2.3* 2.3* 2.1*   No results for input(s): LIPASE, AMYLASE in the last 168 hours.  Recent Labs Lab 05/22/14 1316  AMMONIA 43*   CBC:  Recent Labs Lab  05/22/14 1313 05/23/14 0540 05/23/14 0930 05/23/14 1505 05/27/14 0540  WBC 5.9 4.2  --  5.2 4.8  HGB 12.8* 11.6*  --  11.6* 11.6*  HCT 40.4 36.5* 37.3* 36.2* 36.5*  MCV 99.5 99.2  --  99.5 99.7  PLT 88* 65*  --  67* 75*   Cardiac Enzymes: No results for input(s): CKTOTAL, CKMB, CKMBINDEX, TROPONINI in the last 168 hours. BNP: Invalid input(s): POCBNP CBG: No results for input(s): GLUCAP in the last 168 hours.  Recent Results (from the past 240 hour(s))  Urine culture     Status: None   Collection Time: 05/23/14  9:58 AM  Result Value Ref Range Status   Specimen Description URINE, CLEAN CATCH  Final   Special Requests NONE  Final   Colony Count   Final    7,000 COLONIES/ML Performed at [facility]    Culture   Final    INSIGNIFICANT GROWTH Performed at [facility]    Report Status 05/24/2014 FINAL  Final     Scheduled Meds: . folic acid  1 mg Oral Daily  . multivitamin with minerals  1 tablet Oral Daily  . sodium chloride  3 mL Intravenous Q12H  . thiamine IV  500 mg Intravenous Q24H   Continuous Infusions: . sodium chloride 100 mL/hr at 05/27/14 1044     Xiao Graul, DO  [facility] Pager [phone]  If 7PM-7AM, please contact night-coverage [email or web address] Password [email or web address] 05/27/2014, 7:16 PM   LOS: 5 days

## 2014-05-27 NOTE — Clinical Social Work Note (Signed)
CSW spoke with Thomas Navarro at [facility] and they are asking for clinicals to be faxed to them for consideration. CSW has submitted requested clinicals to them for review. CSW also spoke with patient's wife who plans to visit later today and is also hopeful for his admission there at dc. CSW will update as progress is made-   Eduard Clos, MSW, [place]

## 2014-05-28 ENCOUNTER — Inpatient Hospital Stay (HOSPITAL_COMMUNITY): Payer: BLUE CROSS/BLUE SHIELD

## 2014-05-28 LAB — COMPREHENSIVE METABOLIC PANEL
ALK PHOS: 129 U/L — AB (ref 39–117)
ALT: 63 U/L — ABNORMAL HIGH (ref 0–53)
ANION GAP: 3 — AB (ref 5–15)
AST: 67 U/L — ABNORMAL HIGH (ref 0–37)
Albumin: 2.3 g/dL — ABNORMAL LOW (ref 3.5–5.2)
BUN: 9 mg/dL (ref 6–23)
CALCIUM: 9.9 mg/dL (ref 8.4–10.5)
CO2: 24 mmol/L (ref 19–32)
CREATININE: 0.7 mg/dL (ref 0.50–1.35)
Chloride: 108 mmol/L (ref 96–112)
GFR calc Af Amer: >90 mL/min (ref >90)
GFR calc non Af Amer: >90 mL/min (ref >90)
Glucose, Bld: 112 mg/dL — ABNORMAL HIGH (ref 70–99)
POTASSIUM: 3.8 mmol/L (ref 3.5–5.1)
Sodium: 135 mmol/L (ref 135–145)
TOTAL PROTEIN: 5.9 g/dL — AB (ref 6.0–8.3)
Total Bilirubin: 2.4 mg/dL — ABNORMAL HIGH (ref 0.3–1.2)

## 2014-05-28 MED ORDER — LORAZEPAM 2 MG/ML IJ SOLN
2.0000 mg | Freq: Once | INTRAMUSCULAR | Status: AC
  Administered 2014-05-28: 2 mg via INTRAVENOUS

## 2014-05-28 MED ORDER — THIAMINE HCL 100 MG/ML IJ SOLN
250.0000 mg | INTRAVENOUS | Status: AC
  Administered 2014-05-28 – 2014-05-31 (×4): 250 mg via INTRAVENOUS
  Filled 2014-05-28 (×4): qty 2.5

## 2014-05-28 MED ORDER — THIAMINE HCL 100 MG/ML IJ SOLN
250.0000 mg | INTRAMUSCULAR | Status: DC
  Filled 2014-05-28: qty 2.5

## 2014-05-28 MED ORDER — GUAIFENESIN-DM 100-10 MG/5ML PO SYRP
5.0000 mL | ORAL_SOLUTION | ORAL | Status: DC | PRN
  Administered 2014-05-28 – 2014-06-01 (×3): 5 mL via ORAL
  Filled 2014-05-28 (×5): qty 10

## 2014-05-28 NOTE — Progress Notes (Signed)
CARE MANAGEMENT NOTE 05/28/2014  Patient:  DRUE, HARR   Account Number:  [id number]  Date Initiated:  05/28/2014  Documentation initiated by:  Edwyna Shell  Subjective/Objective Assessment:   64 yo male admitted with acute encephalopathy secondary to ETOH withdrawal     Action/Plan:   discharge planning   Anticipated DC Date:  05/30/2014   Anticipated DC Plan:    In-house referral  Clinical Social Worker      DC Planning Services  CM consult      Choice offered to / List presented to:             Status of service:  In process, will continue to follow Medicare Important Message given?   (If response is "NO", the following Medicare IM given date fields will be blank) Date Medicare IM given:   Medicare IM given by:   Date Additional Medicare IM given:   Additional Medicare IM given by:    Discharge Disposition:    Per UR Regulation:    If discussed at Long Length of Stay Meetings, dates discussed:    Comments:  05/28/14 Charise Killian RN BSN CM [phone] Spoke with patient spouse and she stated that her preference if for patient to be discharged and go directly to [facility] if he is eligible. Spouse is in contact with [facility] and [facility] CSW. She stated that the patient was living at home indepdently prior to hospitalization. Will continue to follow.

## 2014-05-28 NOTE — Clinical Social Work Note (Signed)
Patient discussed in unit rounds with MD and RN today- per MD, he is medically clearing and to have MRI but wil likely be ready for dc tomorrow. Advised patient's wife we have not heard back from [place] about his application/determination for admit. Wife requesting staff assist him with a shower- will advise unit staff.  Follow up call to [facility] for update- will advise.    Eduard Clos, MSW, [place]

## 2014-05-28 NOTE — Progress Notes (Signed)
PROGRESS NOTE  Thomas Navarro IRS:[id number] DOB: October 19, 1950 DOA: 05/22/2014 PCP: No PCP Per Patient Brief history 64 year old male with a history of chronic back pain on chronic opioids, alcohol dependence, alcoholic liver disease presents with 4-5 day history of worsening confusion. Apparently, the patient drinks approximately 1/5 of liquor every week as well as beer on a daily basis. However, he does not consider beer alcohol infrequently minimizes his use. The patient was at [facility] on 05/22/2014. Because of his medical instability, the patient was sent to the emergency department. There is been no reports of chest pain, shortness of breath, vomiting, dysuria, hematuria, hematochezia, melena. The patient's wife states that the patient may have been using more oxycodone than prescribed. Since admission, the patient was placed on alcohol withdrawal protocol. Initially, his Ativan dosing and frequency had to be increased. The patient's agitation and aggressive behavior has improved although he remains confused at this point. Assessment/Plan: Acute encephalopathy--suspect Wernicke's Encephalopathy -Multifactorial including combination of alcohol and narcotic overuse, possible Wernicke encephalopathy, hypercalcemia, hyponatremia , and Etoh withdraw -Check serum B12, RBC folate--unremarkable  -Check TSH--1.435 -Check HIV, RPR--neg -Urinalysis --negative for pyuria  -Urine drug screen is negative  -Alcohol level negative at the time of admission  -thiamine 500mg  IV q 8 hour x 2 days--then 500mg  daily-->not much improvement -ammonia 43--doubt was the cause of his encephalopathy -05/28/14 remains pleasantly confused but not aggressive and no hallucinations -05/28/14--MRI brain if pt is able to tolerate -long discussion with wife--explained that encephalopathy may take weeks to improve Alcohol dependence/Alcohol withdraw -Alcohol withdrawal protocol  -05/24/14--increased  Ativan to 2 mg IV every 4 hours -05/25/14--having hallucinations, remains confused and intermittently aggitated -05/27/2014--less agitated, but remains confused Hypercalcemia  -Continue IV fluids --> improving (stable between 11.0-11.2) -Intact PTH--28  -Wife states that the patient had a screening colonoscopy through 5-10 years ago which was "negative"  -check urine calcium/creatinine ratio--0.01-->may have [place] -25-vitamin D--37.7 -1,25 viatmin D--pending Liver lesion--concerning for hepatoma -previous CT's of abdomen with contrast suggested hemangioma -check AFP--3846-->concerned about hepatoma -discussed with wife, she is aware -discussed with radiology, Dr. Lemmie Evens. [facility]--strongly recommended MRI of liver be done as outpt as cannot follow strict commands required for test -pt too confused for MRI liver presently Thrombocytopenia  -Secondary to alcoholic liver disease  -Continue to monitor  Hyponatremia  -likely a combination of volume depletion in setting of cirrhosis  Chronic back pain  -d/c tramadol -judicious opioids -short trial of muscle relaxer--seems to be less complaining of pain mechanical fall - 05/24/2014-- patient had mechanical fall complains of left hip pain and left ankle pain - 05/25/14--pain in L-hip and L- ankle resolved, able to ambulate  Family Communication: wife updated at bedside 4/19--total time 40 min, >50% spent counseling and coordinating care  Disposition Plan: [facility] vs home with PT 1-2 days          Procedures/Studies: Ct Head Wo Contrast  05/22/2014   CLINICAL DATA:  64 year old male with altered mental status and confusion for 1 week. Initial encounter.  EXAM: CT HEAD WITHOUT CONTRAST  TECHNIQUE: Contiguous axial images were obtained from the base of the skull through the vertex without intravenous contrast.  COMPARISON:  None.  FINDINGS: Visualized paranasal sinuses and mastoids are clear. No acute osseous abnormality  identified. Visualized orbits and scalp soft tissues are within normal limits. Calcified atherosclerosis at the skull base.  Cerebral volume is within normal limits for age. No midline shift, ventriculomegaly, mass  effect, evidence of mass lesion, intracranial hemorrhage or evidence of cortically based acute infarction. Gray-white matter differentiation is within normal limits throughout the brain. No suspicious intracranial vascular hyperdensity.  IMPRESSION: Normal for age non contrast CT appearance of the brain.   Electronically Signed   By: Genevie Ann M.D.   On: 05/22/2014 14:28   Dg Chest Port 1 View  05/22/2014   CLINICAL DATA:  Weakness today, chronic back pain  EXAM: PORTABLE CHEST - 1 VIEW  COMPARISON:  Portable exam 1425 hours without priors for comparison  FINDINGS: Enlargement of cardiac silhouette with pulmonary vascular congestion, likely accentuated by hypoinflation and technique.  Decreased lung volumes with minimal RIGHT basilar atelectasis.  Minimal central peribronchial thickening.  No acute infiltrate, pleural effusion or pneumothorax.  IMPRESSION: Minimal bronchitic changes and RIGHT basilar atelectasis.   Electronically Signed   By: Lavonia Dana M.D.   On: 05/22/2014 14:48   US Abdomen Limited Ruq  05/23/2014   CLINICAL DATA:  64 year old male with cirrhosis and solitary liver lesion. Subsequent encounter.  EXAM: US ABDOMEN LIMITED - RIGHT UPPER QUADRANT  COMPARISON:  CT Abdomen and Pelvis 03/13/2013.  FINDINGS: Gallbladder:  No gallstones identified. No sonographic Murphy sign noted. Gallbladder wall thickness at the upper limits of normal, 3 mm. No pericholecystic fluid.  Common bile duct:  Diameter: 3 mm, normal  Liver: Mildly diffuse increased liver echo. Nodular superficial liver contour. Vague hypoechoic nodule identified on image 42 measuring 10 mm diameter. Larger more complex lesion on image 47 measuring up to 1.6 cm. This appears to be in the left lobe.  Two mostly hyperechoic  lesions then are identified in the right lobe, 1 near the gallbladder fossa measures 2.9 cm diameter on image 65. A second lesion on image 71 measures 3 cm diameter.  No intrahepatic biliary ductal dilatation.  Other findings: No ascites identified. Negative visible right kidney  IMPRESSION: 1. Cirrhosis with multiple 1.5-3 cm diameter indeterminate liver lesions identified. Outpatient follow-up Abdomen MRI without and with contrast (Liver protocol) is indicated to further characterize, but should be deferred until after discharge from the hospital to facilitate optimal imaging quality. 2. Negative gallbladder.  No biliary obstruction.   Electronically Signed   By: Genevie Ann M.D.   On: 05/23/2014 08:47         Subjective: Patient complains of back pain but denies any fevers, chills, chest pain, short of breath, vomiting, diarrhea, abdominal pain.  Objective: Filed Vitals:   05/27/14 2118 05/28/14 0500 05/28/14 1000 05/28/14 1410  BP:  147/62 149/67 156/77  Pulse:  87 91 89  Temp:   97.8 F (36.6 C) 98.4 F (36.9 C)  TempSrc: Oral  Oral Oral  Resp: 20  20 18   Height:      Weight:      SpO2:   93% 95%    Intake/Output Summary (Last 24 hours) at 05/28/14 1644 Last data filed at 05/28/14 0848  Gross per 24 hour  Intake    180 ml  Output    500 ml  Net   -320 ml   Weight change:  Exam:   General:  Pt is alert, follows commands appropriately, not in acute distress  HEENT: No icterus, No thrush, No neck mass, [place]/AT  Cardiovascular: RRR, S1/S2, no rubs, no gallops  Respiratory: CTA bilaterally, no wheezing, no crackles, no rhonchi  Abdomen: Soft/+BS, non tender, non distended, no guarding  Extremities: No edema, No lymphangitis, No petechiae, No rashes, no synovitis  Data  Reviewed: Basic Metabolic Panel:  Recent Labs Lab 05/24/14 0550 05/25/14 0544 05/26/14 0534 05/27/14 0540 05/28/14 0538  NA 133* 135 135 132* 135  K 4.0 4.0 3.9 3.8 3.8  CL 106 106 108 106 108    CO2 24 27 25 23 24   GLUCOSE 140* 116* 110* 119* 112*  BUN 7 10 10 9 9   CREATININE 0.70 0.83 0.74 0.68 0.70  CALCIUM 10.2 10.3 10.1 9.5 9.9   Liver Function Tests:  Recent Labs Lab 05/24/14 0550 05/25/14 0544 05/26/14 0534 05/27/14 0540 05/28/14 0538  AST 35 47* 73* 65* 67*  ALT 34 41 63* 58* 63*  ALKPHOS 85 96 121* 115 129*  BILITOT 1.3* 1.7* 2.5* 2.0* 2.4*  PROT 5.9* 6.1 5.8* 5.6* 5.9*  ALBUMIN 2.3* 2.3* 2.3* 2.1* 2.3*   No results for input(s): LIPASE, AMYLASE in the last 168 hours.  Recent Labs Lab 05/22/14 1316  AMMONIA 43*   CBC:  Recent Labs Lab 05/22/14 1313 05/23/14 0540 05/23/14 0930 05/23/14 1505 05/27/14 0540  WBC 5.9 4.2  --  5.2 4.8  HGB 12.8* 11.6*  --  11.6* 11.6*  HCT 40.4 36.5* 37.3* 36.2* 36.5*  MCV 99.5 99.2  --  99.5 99.7  PLT 88* 65*  --  67* 75*   Cardiac Enzymes: No results for input(s): CKTOTAL, CKMB, CKMBINDEX, TROPONINI in the last 168 hours. BNP: Invalid input(s): POCBNP CBG: No results for input(s): GLUCAP in the last 168 hours.  Recent Results (from the past 240 hour(s))  Urine culture     Status: None   Collection Time: 05/23/14  9:58 AM  Result Value Ref Range Status   Specimen Description URINE, CLEAN CATCH  Final   Special Requests NONE  Final   Colony Count   Final    7,000 COLONIES/ML Performed at [facility]    Culture   Final    INSIGNIFICANT GROWTH Performed at [facility]    Report Status 05/24/2014 FINAL  Final     Scheduled Meds: . folic acid  1 mg Oral Daily  . LORazepam  2 mg Intravenous Once  . multivitamin with minerals  1 tablet Oral Daily  . sodium chloride  3 mL Intravenous Q12H  . thiamine (VITAMIN B1) IVPB  250 mg Intravenous Q24H   Continuous Infusions: . sodium chloride 100 mL/hr at 05/28/14 0831     Shontay Wallner, DO  [facility] Pager [phone]  If 7PM-7AM, please contact night-coverage [email or web address] Password [email or web address] 05/28/2014, 4:44 PM   LOS: 6 days

## 2014-05-29 DIAGNOSIS — F10231 Alcohol dependence with withdrawal delirium: Secondary | ICD-10-CM

## 2014-05-29 DIAGNOSIS — K7031 Alcoholic cirrhosis of liver with ascites: Secondary | ICD-10-CM

## 2014-05-29 DIAGNOSIS — G9340 Encephalopathy, unspecified: Secondary | ICD-10-CM

## 2014-05-29 DIAGNOSIS — D696 Thrombocytopenia, unspecified: Secondary | ICD-10-CM

## 2014-05-29 DIAGNOSIS — F1010 Alcohol abuse, uncomplicated: Secondary | ICD-10-CM

## 2014-05-29 DIAGNOSIS — K7460 Unspecified cirrhosis of liver: Secondary | ICD-10-CM | POA: Insufficient documentation

## 2014-05-29 LAB — COMPREHENSIVE METABOLIC PANEL
ALBUMIN: 2.1 g/dL — AB (ref 3.5–5.2)
ALT: 61 U/L — ABNORMAL HIGH (ref 0–53)
AST: 61 U/L — AB (ref 0–37)
Alkaline Phosphatase: 126 U/L — ABNORMAL HIGH (ref 39–117)
Anion gap: 4 — ABNORMAL LOW (ref 5–15)
BUN: 10 mg/dL (ref 6–23)
CO2: 24 mmol/L (ref 19–32)
CREATININE: 0.66 mg/dL (ref 0.50–1.35)
Calcium: 10.1 mg/dL (ref 8.4–10.5)
Chloride: 108 mmol/L (ref 96–112)
GFR calc Af Amer: >90 mL/min (ref >90)
Glucose, Bld: 104 mg/dL — ABNORMAL HIGH (ref 70–99)
Potassium: 3.8 mmol/L (ref 3.5–5.1)
Sodium: 136 mmol/L (ref 135–145)
Total Bilirubin: 2.5 mg/dL — ABNORMAL HIGH (ref 0.3–1.2)
Total Protein: 5.8 g/dL — ABNORMAL LOW (ref 6.0–8.3)

## 2014-05-29 LAB — AMMONIA: Ammonia: 24 umol/L (ref 11–32)

## 2014-05-29 LAB — HEPATITIS B SURFACE ANTIGEN: Hepatitis B Surface Ag: NEGATIVE

## 2014-05-29 LAB — HEPATITIS C ANTIBODY: HCV Ab: REACTIVE — AB

## 2014-05-29 MED ORDER — LORAZEPAM 2 MG/ML IJ SOLN
0.5000 mg | Freq: Four times a day (QID) | INTRAMUSCULAR | Status: DC | PRN
  Administered 2014-05-29 – 2014-05-30 (×4): 0.5 mg via INTRAVENOUS
  Filled 2014-05-29 (×4): qty 1

## 2014-05-29 MED ORDER — LACTULOSE 10 GM/15ML PO SOLN
20.0000 g | Freq: Three times a day (TID) | ORAL | Status: DC
  Administered 2014-05-29 – 2014-05-30 (×3): 20 g via ORAL
  Filled 2014-05-29 (×5): qty 30

## 2014-05-29 MED ORDER — OXYCODONE HCL 5 MG PO TABS
2.5000 mg | ORAL_TABLET | Freq: Three times a day (TID) | ORAL | Status: DC | PRN
  Administered 2014-05-30 – 2014-06-01 (×4): 2.5 mg via ORAL
  Filled 2014-05-29 (×5): qty 1

## 2014-05-29 MED ORDER — RIFAXIMIN 550 MG PO TABS
550.0000 mg | ORAL_TABLET | Freq: Two times a day (BID) | ORAL | Status: DC
  Administered 2014-05-29 – 2014-06-07 (×17): 550 mg via ORAL
  Filled 2014-05-29 (×19): qty 1

## 2014-05-29 NOTE — Progress Notes (Signed)
PROGRESS NOTE  Thomas Navarro EVO:[id number] DOB: 01/15/1951 DOA: 05/22/2014 PCP: No PCP Per Patient Brief history 64 year old male with a history of chronic back pain on chronic opioids, alcohol dependence, alcoholic liver disease presented with 4-5 day history of worsening confusion. Apparently, the patient drinks approximately 1/5 of liquor every week as well as beer on a daily basis. However, he does not consider beer alcohol infrequently minimizes his use. The patient was at [facility] on 05/22/2014. Because of his medical instability, the patient was sent to the emergency department. The patient's wife states that the patient may have been using more oxycodone than prescribed. Since admission, the patient was placed on alcohol withdrawal protocol. The patient's agitation and aggressive behavior has improved although he remains confused at this point.  Assessment/Plan: Acute encephalopathy--suspect Wernicke's Encephalopathy -Multifactorial including combination of alcohol and narcotic overuse, possible Wernicke encephalopathy, hypercalcemia, hyponatremia , and Etoh withdrawal -Check serum B12, RBC folate--unremarkable  -Check TSH--1.435 -Check HIV, RPR--neg -Urinalysis --negative for pyuria  -Urine drug screen is negative  -Alcohol level negative at the time of admission  -thiamine 500mg  IV q 8 hour x 2 days--then 500mg  daily-->not much improvement -ammonia 43--doubt was the cause of his encephalopathy - remains pleasantly confused but not aggressive and no hallucinations - MRI brain: No acute findings - Continue lactulose and rifaximin - Minimize opioids and sedative medications  Alcohol dependence/Alcohol withdraw -Alcohol withdrawal protocol  - Now seems to be out of acute alcohol withdrawal.  Hypercalcemia  -Continue IV fluids --> improving (stable between 11.0-11.2) -Intact PTH--28  -Wife states that the patient had a screening colonoscopy through  5-10 years ago which was "negative"  -check urine calcium/creatinine ratio--0.01-->may have [facility] -25-vitamin D--37.7 -1,25 viatmin D--pending - Improved  Liver lesion--concerning for hepatoma -previous CT's of abdomen with contrast suggested hemangioma -check AFP--3846-->concerned about hepatoma -discussed with wife, she is aware -discussed with radiology, Dr. Lemmie Evens. Hall--strongly recommended MRI of liver be done as outpt as cannot follow strict commands required for test - Consult and GI who agreed that his liver lesions and elevated AFP are concerning for hepatocellular carcinoma and will need cross-sectional imaging with MRI when he can cooperate.  Thrombocytopenia  -Secondary to alcoholic liver disease  -Continue to monitor  - Stable  Hyponatremia  -likely a combination of volume depletion in setting of cirrhosis - Resolved  Chronic back pain  -d/c tramadol -judicious opioids -short trial of muscle relaxer--seems to be less complaining of pain  mechanical fall - 05/24/2014-- patient had mechanical fall complains of left hip pain and left ankle pain - 05/25/14--pain in L-hip and L- ankle resolved, able to ambulate  CODE STATUS: Full Family Communication: Discussed with spouse on 4/20 Disposition Plan: [facility] vs home with PT 1-2 days     Procedures/Studies: Ct Head Wo Contrast  05/22/2014   CLINICAL DATA:  64 year old male with altered mental status and confusion for 1 week. Initial encounter.  EXAM: CT HEAD WITHOUT CONTRAST  TECHNIQUE: Contiguous axial images were obtained from the base of the skull through the vertex without intravenous contrast.  COMPARISON:  None.  FINDINGS: Visualized paranasal sinuses and mastoids are clear. No acute osseous abnormality identified. Visualized orbits and scalp soft tissues are within normal limits. Calcified atherosclerosis at the skull base.  Cerebral volume is within normal limits for age. No midline shift,  ventriculomegaly, mass effect, evidence of mass lesion, intracranial hemorrhage or evidence of cortically based acute infarction. Gray-white matter  differentiation is within normal limits throughout the brain. No suspicious intracranial vascular hyperdensity.  IMPRESSION: Normal for age non contrast CT appearance of the brain.   Electronically Signed   By: Genevie Ann M.D.   On: 05/22/2014 14:28   Mr Brain Wo Contrast  05/28/2014   CLINICAL DATA:  Acute encephalopathy, evaluate Wernicke encephalopathy versus other etiology. History of alcohol abuse, hypercalcemia, thrombocytopenia.  EXAM: MRI HEAD WITHOUT CONTRAST  TECHNIQUE: Multiplanar, multiecho pulse sequences of the brain and surrounding structures were obtained without intravenous contrast.  COMPARISON:  CT of the head February 20, 2014  FINDINGS: Mildly motion degraded examination.  The ventricles and sulci are normal for patient's age. No abnormal parenchymal signal, mass lesions, mass effect. No reduced diffusion to suggest acute ischemia. No susceptibility artifact to suggest hemorrhage. A few punctate supratentorial white matter T2 hyperintensities are less than expected for age. Faint subcentimeter T2 hyperintensity in LEFT thalamus.  No abnormal extra-axial fluid collections. No extra-axial masses though, contrast enhanced sequences would be more sensitive. Normal major intracranial vascular flow voids seen at the skull base. T2 hypointensity within RIGHT anterior clinoid consistent with pneumatization as seen on prior CT.  Ocular globes and orbital contents are unremarkable though not tailored for evaluation. No abnormal sellar expansion. Mild maxillary sinus mucosal thickening with bilateral maxillary sinus mucosal retention cyst. Trace RIGHT mastoid effusion. No suspicious calvarial bone marrow signal. No abnormal sellar expansion. Craniocervical junction maintained.  IMPRESSION: No acute intracranial process on this mildly motion degraded examination.   Minimal white matter changes, less than expected for age could represent chronic small vessel ischemic disease. Small perivascular space versus lacunar infarct LEFT thalamus.   Electronically Signed   By: Elon Alas   On: 05/28/2014 22:03   Dg Chest Port 1 View  05/22/2014   CLINICAL DATA:  Weakness today, chronic back pain  EXAM: PORTABLE CHEST - 1 VIEW  COMPARISON:  Portable exam 1425 hours without priors for comparison  FINDINGS: Enlargement of cardiac silhouette with pulmonary vascular congestion, likely accentuated by hypoinflation and technique.  Decreased lung volumes with minimal RIGHT basilar atelectasis.  Minimal central peribronchial thickening.  No acute infiltrate, pleural effusion or pneumothorax.  IMPRESSION: Minimal bronchitic changes and RIGHT basilar atelectasis.   Electronically Signed   By: Lavonia Dana M.D.   On: 05/22/2014 14:48   US Abdomen Limited Ruq  05/23/2014   CLINICAL DATA:  64 year old male with cirrhosis and solitary liver lesion. Subsequent encounter.  EXAM: US ABDOMEN LIMITED - RIGHT UPPER QUADRANT  COMPARISON:  CT Abdomen and Pelvis 03/13/2013.  FINDINGS: Gallbladder:  No gallstones identified. No sonographic Murphy sign noted. Gallbladder wall thickness at the upper limits of normal, 3 mm. No pericholecystic fluid.  Common bile duct:  Diameter: 3 mm, normal  Liver: Mildly diffuse increased liver echo. Nodular superficial liver contour. Vague hypoechoic nodule identified on image 42 measuring 10 mm diameter. Larger more complex lesion on image 47 measuring up to 1.6 cm. This appears to be in the left lobe.  Two mostly hyperechoic lesions then are identified in the right lobe, 1 near the gallbladder fossa measures 2.9 cm diameter on image 65. A second lesion on image 71 measures 3 cm diameter.  No intrahepatic biliary ductal dilatation.  Other findings: No ascites identified. Negative visible right kidney  IMPRESSION: 1. Cirrhosis with multiple 1.5-3 cm diameter  indeterminate liver lesions identified. Outpatient follow-up Abdomen MRI without and with contrast (Liver protocol) is indicated to further characterize, but should be deferred  until after discharge from the hospital to facilitate optimal imaging quality. 2. Negative gallbladder.  No biliary obstruction.   Electronically Signed   By: Genevie Ann M.D.   On: 05/23/2014 08:47        Subjective: Patient denies complaints and insists on going home. Complaints of chronic back pain  Objective: Filed Vitals:   05/28/14 1000 05/28/14 1410 05/28/14 2202 05/29/14 1441  BP: 149/67 156/77 148/61 144/80  Pulse: 91 89 94 89  Temp: 97.8 F (36.6 C) 98.4 F (36.9 C) 98.6 F (37 C) 98.5 F (36.9 C)  TempSrc: Oral Oral Oral Oral  Resp: 20 18 18 18   Height:      Weight:      SpO2: 93% 95% 94% 93%    Intake/Output Summary (Last 24 hours) at 05/29/14 1822 Last data filed at 05/29/14 1724  Gross per 24 hour  Intake    100 ml  Output    800 ml  Net   -700 ml   Weight change:  Exam:   General:  Pt is alert, follows commands appropriately, not in acute distress  HEENT: No icterus, No thrush, No neck mass, [place]/AT  Cardiovascular: RRR, S1/S2, no rubs, no gallops  Respiratory: CTA bilaterally, no wheezing, no crackles, no rhonchi  Abdomen: Soft/+BS, non tender, non distended, no guarding  Extremities: No edema, No lymphangitis, No petechiae, No rashes, no synovitis  CNS: Alert and oriented 2. No focal deficits  Data Reviewed: Basic Metabolic Panel:  Recent Labs Lab 05/25/14 0544 05/26/14 0534 05/27/14 0540 05/28/14 0538 05/29/14 0550  NA 135 135 132* 135 136  K 4.0 3.9 3.8 3.8 3.8  CL 106 108 106 108 108  CO2 27 25 23 24 24   GLUCOSE 116* 110* 119* 112* 104*  BUN 10 10 9 9 10   CREATININE 0.83 0.74 0.68 0.70 0.66  CALCIUM 10.3 10.1 9.5 9.9 10.1   Liver Function Tests:  Recent Labs Lab 05/25/14 0544 05/26/14 0534 05/27/14 0540 05/28/14 0538 05/29/14 0550  AST 47* 73* 65*  67* 61*  ALT 41 63* 58* 63* 61*  ALKPHOS 96 121* 115 129* 126*  BILITOT 1.7* 2.5* 2.0* 2.4* 2.5*  PROT 6.1 5.8* 5.6* 5.9* 5.8*  ALBUMIN 2.3* 2.3* 2.1* 2.3* 2.1*   No results for input(s): LIPASE, AMYLASE in the last 168 hours.  Recent Labs Lab 05/29/14 0550  AMMONIA 24   CBC:  Recent Labs Lab 05/23/14 0540 05/23/14 0930 05/23/14 1505 05/27/14 0540  WBC 4.2  --  5.2 4.8  HGB 11.6*  --  11.6* 11.6*  HCT 36.5* 37.3* 36.2* 36.5*  MCV 99.2  --  99.5 99.7  PLT 65*  --  67* 75*   Cardiac Enzymes: No results for input(s): CKTOTAL, CKMB, CKMBINDEX, TROPONINI in the last 168 hours. BNP: Invalid input(s): POCBNP CBG: No results for input(s): GLUCAP in the last 168 hours.  Recent Results (from the past 240 hour(s))  Urine culture     Status: None   Collection Time: 05/23/14  9:58 AM  Result Value Ref Range Status   Specimen Description URINE, CLEAN CATCH  Final   Special Requests NONE  Final   Colony Count   Final    7,000 COLONIES/ML Performed at [facility]    Culture   Final    INSIGNIFICANT GROWTH Performed at [facility]    Report Status 05/24/2014 FINAL  Final     Scheduled Meds: . folic acid  1 mg Oral Daily  .  lactulose  20 g Oral TID  . multivitamin with minerals  1 tablet Oral Daily  . rifaximin  550 mg Oral BID  . sodium chloride  3 mL Intravenous Q12H  . thiamine (VITAMIN B1) IVPB  250 mg Intravenous Q24H   Continuous Infusions: . sodium chloride 100 mL/hr at 05/29/14 1128     Sharyah Bostwick, MD, FACP, FHM. [facility] Pager [phone]  If 7PM-7AM, please contact night-coverage [email or web address] Password [email or web address] 05/29/2014, 6:30 PM   LOS: 7 days

## 2014-05-29 NOTE — Evaluation (Signed)
Physical Therapy Evaluation Patient Details Name: Thomas Navarro MRN: [id number] DOB: 01-14-1951 Today's Date: 05/29/2014   History of Present Illness  Thomas Navarro is a 63 y.o. male with a past medical history of chronic back pain on narcotic analgesics for the past year, alcohol abuse, presenting to the emergency department 05/22/14 after attempting to check himself into [facility] and was referred to [facility] instead with complaints of mental status changes per wife for several days.  Clinical Impression  Patient is confused, able  To participate in basic evaluation, speech mumbled, not oriented to place situation, date.Presents with high fall risk, stumbled during  [facility] to bed transfer. Patient will benefit from PT to address problems listed in note below. Wife not present for information.    Follow Up Recommendations SNF;Supervision/Assistance - 24 hour    Equipment Recommendations  None recommended by PT    Recommendations for Other Services       Precautions / Restrictions Precautions Precautions: Fall      Mobility  Bed Mobility Overal bed mobility: Needs Assistance;+ 2 for safety/equipment Bed Mobility: Supine to Sit;Sit to Supine     Supine to sit: +2 for physical assistance;+2 for safety/equipment Sit to supine: +2 for physical assistance;+2 for safety/equipment   General bed mobility comments: extra time, tactile cues to initiate sitting up and assist legs onto bed.  Transfers Overall transfer level: Needs assistance Equipment used: Rolling walker (2 wheeled) Transfers: Sit to/from [facility] Sit to Stand: +2 safety/equipment;Mod assist Stand pivot transfers: +2 safety/equipment;+2 physical assistance;Mod assist;Max assist       General transfer comment: patient staggering during transfer 3 feet from [facility] to bed, losty balance and sat onto bed., multimodal cues for safety and task  attention  Ambulation/Gait Ambulation/Gait assistance:  Mod assist;+2 safety/equipment Ambulation Distance (Feet): 60 Feet Assistive device: Rolling walker (2 wheeled) Gait Pattern/deviations: Step-through pattern;Staggering right;Staggering left     General Gait Details: close guarding with safety belt required as patient staggering,  max cues to keep body within RW confines, poor control for turning, steps sideways.  Stairs            Wheelchair Mobility    Modified Rankin (Stroke Patients Only)       Balance Overall balance assessment: History of Falls;Needs assistance Sitting-balance support: No upper extremity supported;Feet supported Sitting balance-Leahy Scale: Fair     Standing balance support: During functional activity;Bilateral upper extremity supported Standing balance-Leahy Scale: Poor                               Pertinent Vitals/Pain Pain Assessment: No/denies pain    Home Living Family/patient expects to be discharged to:: Private residence Living Arrangements: Spouse/significant other               Additional Comments: no family present for  information, patient unable to provide    Prior Function           Comments: info not available     Hand Dominance        Extremity/Trunk Assessment   Upper Extremity Assessment: Generalized weakness           Lower Extremity Assessment: Generalized weakness      Cervical / Trunk Assessment: Other exceptions  Communication   Communication: Expressive difficulties (speech is mumbly, does not complete sentences)  Cognition Arousal/Alertness: Lethargic Behavior During Therapy: Flat affect Overall Cognitive Status: No family/caregiver present to determine baseline  cognitive functioning Area of Impairment: Orientation;Attention;Memory;Following commands;Safety/judgement;Awareness;Problem solving Orientation Level: Place;Time;Situation Current Attention Level: Focused   Following Commands: Follows one step commands  inconsistently Safety/Judgement: Decreased awareness of safety;Decreased awareness of deficits Awareness: Emergent Problem Solving: Slow processing;Decreased initiation;Difficulty sequencing;Requires verbal cues;Requires tactile cues General Comments: did participate in evaluation for automatic activities, difficulty following.  unable  to ansere orientation Question after reoriented  within 5 minutes.    General Comments      Exercises        Assessment/Plan    PT Assessment Patient needs continued PT services  PT Diagnosis Difficulty walking;Abnormality of gait;Generalized weakness;Altered mental status   PT Problem List Decreased activity tolerance;Decreased balance;Decreased mobility;Decreased knowledge of precautions;Decreased safety awareness;Decreased knowledge of use of DME;Decreased cognition  PT Treatment Interventions DME instruction;Gait training;Functional mobility training;Therapeutic activities;Therapeutic exercise;Patient/family education;Cognitive remediation   PT Goals (Current goals can be found in the Care Plan section) Acute Rehab PT Goals PT Goal Formulation: Patient unable to participate in goal setting Time For Goal Achievement: 06/12/14 Potential to Achieve Goals: Fair    Frequency Min 3X/week   Barriers to discharge        Co-evaluation               End of Session Equipment Utilized During Treatment: Gait belt Activity Tolerance: Patient tolerated treatment well Patient left: in bed;with call bell/phone within reach;with nursing/sitter in room Nurse Communication: Mobility status         Time: 1020-1052 PT Time Calculation (min) (ACUTE ONLY): 32 min   Charges:   PT Evaluation $Initial PT Evaluation Tier I: 1 Procedure PT Treatments $Gait Training: 8-22 mins   PT G Codes:        Claretha Cooper 05/29/2014, 11:10 AM  Tresa Endo PT [phone]

## 2014-05-29 NOTE — Consult Note (Signed)
Consultation  Referring Provider: [facility]     Primary Care Physician:  No PCP Per Patient Primary Gastroenterologist: Althia Forts        Reason for Consultation:  Liver lesion in setting of cirrhosis.             HPI:   Thomas Navarro is a 64 y.o. male who presented to ED with altered mental status. He has a history of ETOH abuse, takes narcotics and benzos. Contrast CTscan March 2014 revealed cirrhosis and and right liver mass favored at that time to be a hemangioma.Two other small lesions seen but couldn't be characterized. MRI suggested but never done. His ammonia is 43, head CTscan negative. RUQ ultrasound this admission reveals cirrhosis with multiple 1.5-3 cm diameter indeterminate liver lesions. Wife at bedside, provides history.    Patient continues to drink ETOH at home. Several days ago his appetite diminished but in absence of nausea, or abdominal pain. He hasn't been having many BMs per wife but she attributes that to poor PO intake. Patient developed confusion at home.   Past Medical History  Diagnosis Date  . Chronic back pain   . PONV (postoperative nausea and vomiting)     Past Surgical History  Procedure Laterality Date  . Back surgery      [place]: Not obtained, patient encephalopathic  History  Substance Use Topics  . Smoking status: Never Smoker   . Smokeless tobacco: Never Used  . Alcohol Use: 3.0 oz/week    4 Cans of beer, 1 Shots of liquor per week     Comment: drinks 1/2 of 1/2 of a pint bottle    Prior to Admission medications   Medication Sig Start Date End Date Taking? Authorizing Provider  diazepam (VALIUM) 5 MG tablet Take 1 tablet by mouth 3 (three) times daily as needed for anxiety.  03/12/13  Yes Historical Provider, MD  loperamide (IMODIUM A-D) 2 MG tablet Take 2 mg by mouth 4 (four) times daily as needed for diarrhea or loose stools.   Yes Historical Provider, MD  Oxycodone HCl 10 MG TABS Take 5-10 mg by mouth 3 (three) times daily.  Take up to 3 times a day for back pain   Yes Historical Provider, MD  cephALEXin (KEFLEX) 500 MG capsule Take 1 capsule (500 mg total) by mouth 4 (four) times daily. Patient not taking: Reported on 05/22/2014 11/21/13   Montine Circle, PA-C  oxyCODONE (ROXICODONE) 5 MG immediate release tablet Take 1-2 tablets (5-10 mg total) by mouth every 6 (six) hours as needed for severe pain. Patient not taking: Reported on 05/22/2014 03/13/13   Virgel Manifold, MD    Current Facility-Administered Medications  Medication Dose Route Frequency Provider Last Rate Last Dose  . 0.9 %  sodium chloride infusion   Intravenous Continuous Orson Eva, MD 100 mL/hr at 05/29/14 1128    . acetaminophen (TYLENOL) tablet 650 mg  650 mg Oral Q6H PRN Kelvin Cellar, MD   650 mg at 05/27/14 0049   Or  . acetaminophen (TYLENOL) suppository 650 mg  650 mg Rectal Q6H PRN Kelvin Cellar, MD      . folic acid (FOLVITE) tablet 1 mg  1 mg Oral Daily Kelvin Cellar, MD   1 mg at 05/29/14 1021  . guaiFENesin-dextromethorphan (ROBITUSSIN DM) 100-10 MG/5ML syrup 5 mL  5 mL Oral Q4H PRN Gardiner Barefoot, NP   5 mL at 05/28/14 2324  . LORazepam (ATIVAN) injection 2 mg  2 mg Intravenous  Q4H PRN Orson Eva, MD   2 mg at 05/29/14 1021  . multivitamin with minerals tablet 1 tablet  1 tablet Oral Daily Kelvin Cellar, MD   1 tablet at 05/29/14 1021  . ondansetron (ZOFRAN) tablet 4 mg  4 mg Oral Q6H PRN Kelvin Cellar, MD       Or  . ondansetron (ZOFRAN) injection 4 mg  4 mg Intravenous Q6H PRN Kelvin Cellar, MD   4 mg at 05/24/14 0039  . oxyCODONE (Oxy IR/ROXICODONE) immediate release tablet 5 mg  5 mg Oral Q8H PRN Orson Eva, MD   5 mg at 05/29/14 1021  . sodium chloride 0.9 % injection 3 mL  3 mL Intravenous Q12H Kelvin Cellar, MD   3 mL at 05/22/14 2200  . thiamine (B-1) 250 mg in sodium chloride 0.9 % 50 mL IVPB  250 mg Intravenous Q24H Orson Eva, MD   250 mg at 05/28/14 1758    Allergies as of 05/22/2014 - Review Complete  05/22/2014  Allergen Reaction Noted  . Morphine and related Nausea And Vomiting 03/13/2013   Review of Systems:    Unobtainable, patient encephalopathic    Physical Exam:  Vital signs in last 24 hours: Temp:  [98.4 F (36.9 C)-98.6 F (37 C)] 98.6 F (37 C) (04/19 2202) Pulse Rate:  [89-94] 94 (04/19 2202) Resp:  [18] 18 (04/19 2202) BP: (148-156)/(61-77) 148/61 mmHg (04/19 2202) SpO2:  [94 %-95 %] 94 % (04/19 2202) Weight:  [215 lb (97.523 kg)] 215 lb (97.523 kg) (04/19 2127) Last BM Date: 05/28/14 General:   Pleasantly confused white male in NAD Head:  Normocephalic and atraumatic. Eyes:   No icterus.   Conjunctiva pink. Ears:  Normal auditory acuity. Neck:  Supple; no masses felt Lungs:  A few bilateral lower lobe wheezes. Diminished breath sounds bilaterally.  Heart:  Regular rate and rhythm Abdomen:  Soft, nondistended, nontender. Normal bowel sounds. No appreciable masses or hepatomegaly.  Rectal:  Not performed.  Msk:  Symmetrical without gross deformities.  Extremities:  BLE edema Neurologic:  Alert, pleasant but confused. Cannot do basic math. Mild asterixis. Skin:  Intact without significant lesions or rashes. Cervical Nodes:  No significant cervical adenopathy. Psych:  Alert and cooperative. Normal affect.  LAB RESULTS:  Recent Labs  05/27/14 0540  WBC 4.8  HGB 11.6*  HCT 36.5*  PLT 75*   BMET  Recent Labs  05/27/14 0540 05/28/14 0538 05/29/14 0550  NA 132* 135 136  K 3.8 3.8 3.8  CL 106 108 108  CO2 23 24 24   GLUCOSE 119* 112* 104*  BUN 9 9 10   CREATININE 0.68 0.70 0.66  CALCIUM 9.5 9.9 10.1   LFT  Recent Labs  05/29/14 0550  PROT 5.8*  ALBUMIN 2.1*  AST 61*  ALT 61*  ALKPHOS 126*  BILITOT 2.5*    STUDIES: Mr Brain Wo Contrast  05/28/2014   CLINICAL DATA:  Acute encephalopathy, evaluate Wernicke encephalopathy versus other etiology. History of alcohol abuse, hypercalcemia, thrombocytopenia.  EXAM: MRI HEAD WITHOUT CONTRAST   TECHNIQUE: Multiplanar, multiecho pulse sequences of the brain and surrounding structures were obtained without intravenous contrast.  COMPARISON:  CT of the head February 20, 2014  FINDINGS: Mildly motion degraded examination.  The ventricles and sulci are normal for patient's age. No abnormal parenchymal signal, mass lesions, mass effect. No reduced diffusion to suggest acute ischemia. No susceptibility artifact to suggest hemorrhage. A few punctate supratentorial white matter T2 hyperintensities are less than expected for age. Faint  subcentimeter T2 hyperintensity in LEFT thalamus.  No abnormal extra-axial fluid collections. No extra-axial masses though, contrast enhanced sequences would be more sensitive. Normal major intracranial vascular flow voids seen at the skull base. T2 hypointensity within RIGHT anterior clinoid consistent with pneumatization as seen on prior CT.  Ocular globes and orbital contents are unremarkable though not tailored for evaluation. No abnormal sellar expansion. Mild maxillary sinus mucosal thickening with bilateral maxillary sinus mucosal retention cyst. Trace RIGHT mastoid effusion. No suspicious calvarial bone marrow signal. No abnormal sellar expansion. Craniocervical junction maintained.  IMPRESSION: No acute intracranial process on this mildly motion degraded examination.  Minimal white matter changes, less than expected for age could represent chronic small vessel ischemic disease. Small perivascular space versus lacunar infarct LEFT thalamus.   Electronically Signed   By: Elon Alas   On: 05/28/2014 22:03   PREVIOUS ENDOSCOPIES:            none   Impression / Plan:   1. 64 year old male with cirrhosis / altered mental status / liver lesions. Confusion may be multifactorial (medications, ETOH withdrawal, hepatic encephalopathy). He does have mild asterixis on exam. Liver lesion seen on CTscan 2 years ago but patient didn't get a follow up MRI. Now with multiple  indeterminate liver lesions on ultrasound and an AFP of 3,000.Marland Kitchen   ETOH presumably etiology of cirrhosis but will at least check viral hepatitis studies.    Obviously need to evaluate for hepatocelllular carcinoma. Ideally patient could get MRI to characterize         lesions but not sure he can cooperate right now given mental status changes. Will start treatment for       hepatic encephalopathy.   2. Altered mental status, may be multifactorial (medications, hepatic encephalopathy, ETOH  Though not acutely intoxicated). MRI of brain didn't exclude lacunar infarct of left thalamus. Will begin lactulose and Xifaxan   3. ETOH abuse. States his last drink was 10 days ago. Getting Ativan as needed    Thanks   LOS: 7 days   Tye Savoy  05/29/2014, 12:31 PM

## 2014-05-30 DIAGNOSIS — R160 Hepatomegaly, not elsewhere classified: Secondary | ICD-10-CM | POA: Insufficient documentation

## 2014-05-30 DIAGNOSIS — R894 Abnormal immunological findings in specimens from other organs, systems and tissues: Secondary | ICD-10-CM

## 2014-05-30 DIAGNOSIS — K7030 Alcoholic cirrhosis of liver without ascites: Secondary | ICD-10-CM

## 2014-05-30 DIAGNOSIS — R768 Other specified abnormal immunological findings in serum: Secondary | ICD-10-CM | POA: Insufficient documentation

## 2014-05-30 LAB — VITAMIN D 1,25 DIHYDROXY
Vitamin D 1, 25 (OH)2 Total: 30 pg/mL
Vitamin D3 1, 25 (OH)2: 30 pg/mL

## 2014-05-30 MED ORDER — LACTULOSE 10 GM/15ML PO SOLN
20.0000 g | Freq: Every day | ORAL | Status: DC
  Administered 2014-05-31 – 2014-06-02 (×3): 20 g via ORAL
  Filled 2014-05-30 (×3): qty 30

## 2014-05-30 MED ORDER — LORAZEPAM 2 MG/ML IJ SOLN: 1.0000 mg | INTRAMUSCULAR | Status: DC

## 2014-05-30 NOTE — Progress Notes (Signed)
Per discussion with RN CM, patient spouse decline snf placement and plans for patient to return home with home health services when stable. No further Clinical Social Work needs, signing off.   [place], [place] Work  [employer] [phone]

## 2014-05-30 NOTE — Progress Notes (Signed)
Patient has had 10 loose stools from the administration of the lactulose. Paged Dr. Algis Liming. He instructed me to page Cecille Rubin with GI. She changed dosing of lactulose from 3 times a day to once daily.

## 2014-05-30 NOTE — Progress Notes (Signed)
CARE MANAGEMENT NOTE 05/30/2014  Patient:  Thomas Navarro, Thomas Navarro   Account Number:  [id number]  Date Initiated:  05/28/2014  Documentation initiated by:  Edwyna Shell  Subjective/Objective Assessment:   64 yo male admitted with acute encephalopathy secondary to ETOH withdrawal     Action/Plan:   discharge planning   Anticipated DC Date:  05/30/2014   Anticipated DC Plan:  [facility] referral  Clinical Social Worker      DC Planning Services  CM consult      [facility] Choice  HOME HEALTH   Choice offered to / List presented to:  C-3 Spouse           Status of service:  In process, will continue to follow Medicare Important Message given?   (If response is "NO", the following Medicare IM given date fields will be blank) Date Medicare IM given:   Medicare IM given by:   Date Additional Medicare IM given:   Additional Medicare IM given by:    Discharge Disposition:    Per UR Regulation:    If discussed at Long Length of Stay Meetings, dates discussed:    Comments:  05/30/14 Charise Killian RN BSN CM [phone] Spoke with patient spouse regarding PT recommendation for SNF. She stated that she does not want the patient going to a SNF and plans on taking him home. She is agreeable to [facility] services and provided an agency choice list, also provided a list of private duty agencies for extra support if needed. Will continue to follow for dc needs.  05/28/14 Charise Killian RN BSN CM [phone] Spoke with patient spouse and she stated that her preference if for patient to be discharged and go directly to [facility] if he is eligible. Spouse is in contact with [facility] and [facility] CSW. She stated that the patient was living at home indepdently prior to hospitalization. Will continue to follow.

## 2014-05-30 NOTE — Progress Notes (Signed)
[facility] Progress Note  Subjective:   Less confusion today. Had loose BM.  NO complaints of pain. Denies nausea or vomiting.   Objective:  Vital signs in last 24 hours: Temp:  [97.4 F (36.3 C)-98.5 F (36.9 C)] 98.1 F (36.7 C) (04/21 1033) Pulse Rate:  [89-99] 94 (04/21 0756) Resp:  [16-18] 17 (04/21 0756) BP: (144-154)/(78-84) 144/84 mmHg (04/21 0756) SpO2:  [91 %-93 %] 92 % (04/21 0756) Last BM Date: 05/28/14 General:   Pleasantly confused,oriented to name only, thinks he is in rehab Heart:  Regular rate and rhythm; no murmurs Pulm;lungs clear Abdomen:  Soft, nontender and nondistended. Normal bowel sounds, without guarding, and without rebound.   Extremities:  2-3+ BLE edema Neurologic:Pleasantly confused, mild asterixis Psych: Alert and cooperative.   Intake/Output from previous day: 04/20 0701 - 04/21 0700 In: 310 [P.O.:200; I.V.:110] Out: 554 [Urine:550; Stool:4] Intake/Output this shift: Total I/O In: 100 [P.O.:100] Out: -   Lab Results: No results for input(s): WBC, HGB, HCT, PLT in the last 72 hours. BMET  Recent Labs  05/28/14 0538 05/29/14 0550  NA 135 136  K 3.8 3.8  CL 108 108  CO2 24 24  GLUCOSE 112* 104*  BUN 9 10  CREATININE 0.70 0.66  CALCIUM 9.9 10.1   LFT  Recent Labs  05/29/14 0550  PROT 5.8*  ALBUMIN 2.1*  AST 61*  ALT 61*  ALKPHOS 126*  BILITOT 2.5*   PT/INR No results for input(s): LABPROT, INR in the last 72 hours. Hepatitis Panel  Recent Labs  05/29/14 1515  HEPBSAG NEGATIVE  HCVAB Reactive*    Mr Brain Wo Contrast  05/28/2014   CLINICAL DATA:  Acute encephalopathy, evaluate Wernicke encephalopathy versus other etiology. History of alcohol abuse, hypercalcemia, thrombocytopenia.  EXAM: MRI HEAD WITHOUT CONTRAST  TECHNIQUE: Multiplanar, multiecho pulse sequences of the brain and surrounding structures were obtained without intravenous contrast.  COMPARISON:  CT of the head February 20, 2014   FINDINGS: Mildly motion degraded examination.  The ventricles and sulci are normal for patient's age. No abnormal parenchymal signal, mass lesions, mass effect. No reduced diffusion to suggest acute ischemia. No susceptibility artifact to suggest hemorrhage. A few punctate supratentorial white matter T2 hyperintensities are less than expected for age. Faint subcentimeter T2 hyperintensity in LEFT thalamus.  No abnormal extra-axial fluid collections. No extra-axial masses though, contrast enhanced sequences would be more sensitive. Normal major intracranial vascular flow voids seen at the skull base. T2 hypointensity within RIGHT anterior clinoid consistent with pneumatization as seen on prior CT.  Ocular globes and orbital contents are unremarkable though not tailored for evaluation. No abnormal sellar expansion. Mild maxillary sinus mucosal thickening with bilateral maxillary sinus mucosal retention cyst. Trace RIGHT mastoid effusion. No suspicious calvarial bone marrow signal. No abnormal sellar expansion. Craniocervical junction maintained.  IMPRESSION: No acute intracranial process on this mildly motion degraded examination.  Minimal white matter changes, less than expected for age could represent chronic small vessel ischemic disease. Small perivascular space versus lacunar infarct LEFT thalamus.   Electronically Signed   By: Elon Alas   On: 05/28/2014 22:03    ASSESSMENT/PLAN:   17. 64 year old male with cirrhosis / altered mental status / liver lesions. Confusion may be multifactorial (medications, ETOH withdrawal, hepatic encephalopathy). He does have mild asterixis on exam. Liver lesion seen on CTscan 2 years ago but patient didn't get a follow up MRI. Now with multiple indeterminate liver lesions on ultrasound and an AFP  of 3,000. ETOH likely etiology of cirrhosis, but Hep C antibody positive--will check viral load and genotype.Hep B surface antigen neg. Need to eval for HCC--will order MRI  with 1 dose sedative prior to test. 2. Altered mental status, may be multifactorial (medications, hepatic encephalopathy, ETOH Though not acutely intoxicated). MRI of brain didn't exclude lacunar infarct of left thalamus. Lactulose and xifaxan were started yesterday. 3.ETOH abuse. States his last drink was 11 days ago. Getting Ativan as needed    LOS: 8 days   Derek Laughter, Vita Barley PA-C 05/30/2014, Pager [phone]

## 2014-05-30 NOTE — Progress Notes (Addendum)
PROGRESS NOTE  Thomas Navarro POE:[id number] DOB: 1951/02/05 DOA: 05/22/2014 PCP: No PCP Per Patient Brief history 64 year old male with a history of chronic back pain on chronic opioids, alcohol dependence, alcoholic liver disease presented with 4-5 day history of worsening confusion. Apparently, the patient drinks approximately 1/5 of liquor every week as well as beer on a daily basis. However, he does not consider beer alcohol infrequently minimizes his use. The patient was at [facility] on 05/22/2014. Because of his medical instability, the patient was sent to the emergency department. The patient's wife states that the patient may have been using more oxycodone than prescribed. Since admission, the patient was placed on alcohol withdrawal protocol. The patient's agitation and aggressive behavior has improved although he remains confused at this point.  Assessment/Plan: Acute encephalopathy -Multifactorial including combination of alcohol and narcotic overuse, possible Wernicke encephalopathy, hypercalcemia, hyponatremia, Etoh withdrawal & hepatic encephalopathy. -Check serum B12, RBC folate--unremarkable  -Check TSH--1.435 -Check HIV, RPR--neg -Urinalysis --negative for pyuria  -Urine drug screen is negative  -Alcohol level negative at the time of admission  -thiamine 500mg  IV q 8 hour x 2 days--then 500mg  daily-->not much improvement -ammonia 43 - remains pleasantly confused but not aggressive and no hallucinations. Intermittently agitated. - MRI brain: No acute findings - Continue lactulose and rifaximin. Lactulose dose reduced secondary to 10 BMs today. - Minimize opioids and sedative medications  Alcohol dependence/Alcohol withdrawal - Alcohol withdrawal protocol  - Now seems to be out of acute alcohol withdrawal and encephalopathy may be related to other causes listed above.  Hypercalcemia  - Treated with IV fluids.  -Intact PTH--28  -Wife states  that the patient had a screening colonoscopy through 5-10 years ago which was "negative"  -check urine calcium/creatinine ratio--0.01-->may have [place] -25-vitamin D--37.7 -1,25 viatmin D--pending - Corrected Serum Calcium: 11.6 on 4/21 - Discussed with an endocrinologist on 4/21: check PTHrp >if elevated, then likely tumor related but if normal, could still be primary hyperparathyroidism and did not recommend treating with bisphosphonates at this time.  Liver lesion--concerning for hepatoma -previous CT's of abdomen with contrast suggested hemangioma -check AFP--3846-->concerned about hepatoma -discussed with wife, she is aware -discussed with radiology, Dr. Lemmie Evens. Hall--strongly recommended MRI of liver be done as outpt as cannot follow strict commands required for test - Consulted GI who agreed that his liver lesions and elevated AFP are concerning for hepatocellular carcinoma and will need cross-sectional imaging with MRI when he can cooperate. - Ordered MRI for 4/22 and hopefully patient will cooperate for test.  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 When stable, ?DC SNF     Procedures/Studies: Ct Head Wo Contrast  05/22/2014   CLINICAL DATA:  64 year old male with altered mental status and confusion for 1 week. Initial  encounter.  EXAM: CT HEAD WITHOUT CONTRAST  TECHNIQUE: Contiguous  axial images were obtained from the base of the skull through the vertex without intravenous contrast.  COMPARISON:  None.  FINDINGS: Visualized paranasal sinuses and mastoids are clear. No acute osseous abnormality identified. Visualized orbits and scalp soft tissues are within normal limits. Calcified atherosclerosis at the skull base.  Cerebral volume is within normal limits for age. No midline shift, ventriculomegaly, mass effect, evidence of mass lesion, intracranial hemorrhage or evidence of cortically based acute infarction. Gray-white matter differentiation is within normal limits throughout the brain. No suspicious intracranial vascular hyperdensity.  IMPRESSION: Normal for age non contrast CT appearance of the brain.   Electronically Signed   By: Genevie Ann M.D.   On: 05/22/2014 14:28   Mr Brain Wo Contrast  05/28/2014   CLINICAL DATA:  Acute encephalopathy, evaluate Wernicke encephalopathy versus other etiology. History of alcohol abuse, hypercalcemia, thrombocytopenia.  EXAM: MRI HEAD WITHOUT CONTRAST  TECHNIQUE: Multiplanar, multiecho pulse sequences of the brain and surrounding structures were obtained without intravenous contrast.  COMPARISON:  CT of the head February 20, 2014  FINDINGS: Mildly motion degraded examination.  The ventricles and sulci are normal for patient's age. No abnormal parenchymal signal, mass lesions, mass effect. No reduced diffusion to suggest acute ischemia. No susceptibility artifact to suggest hemorrhage. A few punctate supratentorial white matter T2 hyperintensities are less than expected for age. Faint subcentimeter T2 hyperintensity in LEFT thalamus.  No abnormal extra-axial fluid collections. No extra-axial masses though, contrast enhanced sequences would be more sensitive. Normal major intracranial vascular flow voids seen at the skull base. T2 hypointensity within RIGHT anterior clinoid consistent with  pneumatization as seen on prior CT.  Ocular globes and orbital contents are unremarkable though not tailored for evaluation. No abnormal sellar expansion. Mild maxillary sinus mucosal thickening with bilateral maxillary sinus mucosal retention cyst. Trace RIGHT mastoid effusion. No suspicious calvarial bone marrow signal. No abnormal sellar expansion. Craniocervical junction maintained.  IMPRESSION: No acute intracranial process on this mildly motion degraded examination.  Minimal white matter changes, less than expected for age could represent chronic small vessel ischemic disease. Small perivascular space versus lacunar infarct LEFT thalamus.   Electronically Signed   By: Elon Alas   On: 05/28/2014 22:03   Dg Chest Port 1 View  05/22/2014   CLINICAL DATA:  Weakness today, chronic back pain  EXAM: PORTABLE CHEST - 1 VIEW  COMPARISON:  Portable exam 1425 hours without priors for comparison  FINDINGS: Enlargement of cardiac silhouette with pulmonary vascular congestion, likely accentuated by hypoinflation and technique.  Decreased lung volumes with minimal RIGHT basilar atelectasis.  Minimal central peribronchial thickening.  No acute infiltrate, pleural effusion or pneumothorax.  IMPRESSION: Minimal bronchitic changes and RIGHT basilar atelectasis.   Electronically Signed   By: Lavonia Dana M.D.   On: 05/22/2014 14:48   US Abdomen Limited Ruq  05/23/2014   CLINICAL DATA:  64 year old male with cirrhosis and solitary liver lesion. Subsequent encounter.  EXAM: US ABDOMEN LIMITED - RIGHT UPPER QUADRANT  COMPARISON:  CT Abdomen and Pelvis 03/13/2013.  FINDINGS: Gallbladder:  No gallstones identified. No sonographic Murphy sign noted. Gallbladder wall thickness at the upper limits of normal, 3 mm. No pericholecystic fluid.  Common bile duct:  Diameter: 3 mm, normal  Liver: Mildly diffuse increased liver echo. Nodular superficial liver contour. Vague hypoechoic nodule identified on image 42 measuring 10 mm  diameter. Larger more complex lesion on image 47 measuring up to 1.6 cm. This appears to be in the left lobe.  Two  mostly hyperechoic lesions then are identified in the right lobe, 1 near the gallbladder fossa measures 2.9 cm diameter on image 65. A second lesion on image 71 measures 3 cm diameter.  No intrahepatic biliary ductal dilatation.  Other findings: No ascites identified. Negative visible right kidney  IMPRESSION: 1. Cirrhosis with multiple 1.5-3 cm diameter indeterminate liver lesions identified. Outpatient follow-up Abdomen MRI without and with contrast (Liver protocol) is indicated to further characterize, but should be deferred until after discharge from the hospital to facilitate optimal imaging quality. 2. Negative gallbladder.  No biliary obstruction.   Electronically Signed   By: Genevie Ann M.D.   On: 05/23/2014 08:47        Subjective: Pleasantly confused. Multiple BMs.  Objective: Filed Vitals:   05/30/14 0532 05/30/14 0756 05/30/14 1033 05/30/14 1343  BP: 154/80 144/84 154/79 141/83  Pulse: 99 94 91 91  Temp: 97.4 F (36.3 C) 97.5 F (36.4 C) 98.1 F (36.7 C) 98.1 F (36.7 C)  TempSrc: Oral Oral Oral Oral  Resp: 18 17 17 16   Height:      Weight:      SpO2: 91% 92% 93% 95%    Intake/Output Summary (Last 24 hours) at 05/30/14 1432 Last data filed at 05/30/14 1044  Gross per 24 hour  Intake    210 ml  Output    356 ml  Net   -146 ml   Weight change:  Exam:   General:  Pt is alert, follows commands appropriately, not in acute distress  HEENT: Icterus +, No thrush, No neck mass, [place]/AT  Cardiovascular: RRR, S1/S2, no rubs, no gallops  Respiratory: CTA bilaterally, no wheezing, no crackles, no rhonchi  Abdomen: Soft/+BS, non tender, non distended, no guarding  Extremities: No edema, No lymphangitis, No petechiae, No rashes, no synovitis  CNS: Alert and oriented 1. No focal deficits. [place] +  Data Reviewed: Basic Metabolic Panel:  Recent Labs Lab  05/25/14 0544 05/26/14 0534 05/27/14 0540 05/28/14 0538 05/29/14 0550  NA 135 135 132* 135 136  K 4.0 3.9 3.8 3.8 3.8  CL 106 108 106 108 108  CO2 27 25 23 24 24   GLUCOSE 116* 110* 119* 112* 104*  BUN 10 10 9 9 10   CREATININE 0.83 0.74 0.68 0.70 0.66  CALCIUM 10.3 10.1 9.5 9.9 10.1   Liver Function Tests:  Recent Labs Lab 05/25/14 0544 05/26/14 0534 05/27/14 0540 05/28/14 0538 05/29/14 0550  AST 47* 73* 65* 67* 61*  ALT 41 63* 58* 63* 61*  ALKPHOS 96 121* 115 129* 126*  BILITOT 1.7* 2.5* 2.0* 2.4* 2.5*  PROT 6.1 5.8* 5.6* 5.9* 5.8*  ALBUMIN 2.3* 2.3* 2.1* 2.3* 2.1*   No results for input(s): LIPASE, AMYLASE in the last 168 hours.  Recent Labs Lab 05/29/14 0550  AMMONIA 24   CBC:  Recent Labs Lab 05/23/14 1505 05/27/14 0540  WBC 5.2 4.8  HGB 11.6* 11.6*  HCT 36.2* 36.5*  MCV 99.5 99.7  PLT 67* 75*   Cardiac Enzymes: No results for input(s): CKTOTAL, CKMB, CKMBINDEX, TROPONINI in the last 168 hours. BNP: Invalid input(s): POCBNP CBG: No results for input(s): GLUCAP in the last 168 hours.  Recent Results (from the past 240 hour(s))  Urine culture     Status: None   Collection Time: 05/23/14  9:58 AM  Result Value Ref Range Status   Specimen Description URINE, CLEAN CATCH  Final   Special Requests NONE  Final   Colony Count   Final  7,000 COLONIES/ML Performed at [employer]   Final    INSIGNIFICANT GROWTH Performed at [facility]    Report Status 05/24/2014 FINAL  Final     Scheduled Meds: . folic acid  1 mg Oral Daily  . [START ON 05/31/2014] lactulose  20 g Oral Daily  . LORazepam  1 mg Intravenous 30 min Pre-Op  . multivitamin with minerals  1 tablet Oral Daily  . rifaximin  550 mg Oral BID  . sodium chloride  3 mL Intravenous Q12H  . thiamine (VITAMIN B1) IVPB  250 mg Intravenous Q24H   Continuous Infusions: . sodium chloride 10 mL/hr at 05/29/14 1833     [facility], MD, FACP, FHM. [facility] Pager [phone]  If 7PM-7AM, please contact night-coverage [email or web address] Password [email or web address] 05/30/2014, 2:32 PM   LOS: 8 days

## 2014-05-30 NOTE — Progress Notes (Signed)
Attempted to give pt bedtime dose of rifaximin.  Pt was very agitated and refused medication.  Attempted to explain to pt reason for medication and importance of taking it.  Pt got more agitated, stood up, and got physically violent.  Coaxed pt back to bed, gave dose of ativan, and got pt to lay down.  Pt currently sleeping. No resistance or attempts to get out of bed since.  Was not able to administer dose of rifaximin. Will continue to monitor.  Roselind Rily

## 2014-05-30 NOTE — Progress Notes (Signed)
CSW spoke with Thomas Navarro, admissions staff at [facility], who reported that due to Pt's continued confusion and disorientation, he would not be an appropriate candidate for treatment at their facility.   CSW to discuss with wife further discharge planning needs.  Peri Maris, [place] 05/30/2014 8:46 AM [phone]

## 2014-05-30 NOTE — Progress Notes (Signed)
Pt confused to place, time and situation.  Pt is agitated and attempts to get OOB.  Sitter at pt's side.  Pt has frequent loose stools and unable to get to [facility] in time.  Ativan IV given for agitation and encouraged pt to take robitussin for frequent coughing but pt refused.

## 2014-05-31 DIAGNOSIS — E871 Hypo-osmolality and hyponatremia: Secondary | ICD-10-CM

## 2014-05-31 DIAGNOSIS — F10232 Alcohol dependence with withdrawal with perceptual disturbance: Secondary | ICD-10-CM

## 2014-05-31 LAB — COMPREHENSIVE METABOLIC PANEL
ALT: 55 U/L — ABNORMAL HIGH (ref 0–53)
ANION GAP: 4 — AB (ref 5–15)
AST: 52 U/L — ABNORMAL HIGH (ref 0–37)
Albumin: 2.2 g/dL — ABNORMAL LOW (ref 3.5–5.2)
Alkaline Phosphatase: 124 U/L — ABNORMAL HIGH (ref 39–117)
BUN: 10 mg/dL (ref 6–23)
CO2: 26 mmol/L (ref 19–32)
CREATININE: 0.52 mg/dL (ref 0.50–1.35)
Calcium: 11 mg/dL — ABNORMAL HIGH (ref 8.4–10.5)
Chloride: 110 mmol/L (ref 96–112)
GFR calc non Af Amer: >90 mL/min (ref >90)
GLUCOSE: 123 mg/dL — AB (ref 70–99)
Potassium: 3.7 mmol/L (ref 3.5–5.1)
Sodium: 140 mmol/L (ref 135–145)
TOTAL PROTEIN: 5.8 g/dL — AB (ref 6.0–8.3)
Total Bilirubin: 2.1 mg/dL — ABNORMAL HIGH (ref 0.3–1.2)

## 2014-05-31 LAB — CBC
HCT: 37.5 % — ABNORMAL LOW (ref 39.0–52.0)
Hemoglobin: 11.5 g/dL — ABNORMAL LOW (ref 13.0–17.0)
MCH: 30.5 pg (ref 26.0–34.0)
MCHC: 30.7 g/dL (ref 30.0–36.0)
MCV: 99.5 fL (ref 78.0–100.0)
PLATELETS: 91 10*3/uL — AB (ref 150–400)
RBC: 3.77 MIL/uL — AB (ref 4.22–5.81)
RDW: 14.6 % (ref 11.5–15.5)
WBC: 4.3 10*3/uL (ref 4.0–10.5)

## 2014-05-31 LAB — HCV RNA QUANT
HCV QUANT: 386504 [IU]/mL — AB (ref <15)
HCV Quantitative Log: 5.63 {Log} — ABNORMAL HIGH (ref <1.18)
HCV Quantitative Log: 5.59 {Log} — ABNORMAL HIGH (ref <1.18)
HCV Quantitative: 428167 IU/mL — ABNORMAL HIGH (ref <15)

## 2014-05-31 LAB — AMMONIA: AMMONIA: 30 umol/L (ref 11–32)

## 2014-05-31 LAB — HEPATITIS B SURFACE ANTIBODY,QUALITATIVE: Hep B S Ab: REACTIVE

## 2014-05-31 LAB — HEPATITIS A ANTIBODY, TOTAL: Hep A Total Ab: NEGATIVE

## 2014-05-31 MED ORDER — PANTOPRAZOLE SODIUM 40 MG PO TBEC
40.0000 mg | DELAYED_RELEASE_TABLET | Freq: Two times a day (BID) | ORAL | Status: DC
  Administered 2014-05-31 – 2014-06-07 (×14): 40 mg via ORAL
  Filled 2014-05-31 (×15): qty 1

## 2014-05-31 MED ORDER — QUETIAPINE FUMARATE 25 MG PO TABS
25.0000 mg | ORAL_TABLET | Freq: Two times a day (BID) | ORAL | Status: DC
  Administered 2014-05-31 (×2): 25 mg via ORAL
  Filled 2014-05-31 (×4): qty 1

## 2014-05-31 NOTE — Progress Notes (Addendum)
[facility] PROGRESS NOTE Interim History: 64 year old male with a history of chronic back pain on chronic opioids, alcohol dependence, alcoholic liver disease presented with 4-5 day history of worsening confusion. Apparently, the patient drinks approximately 1/5 of liquor every week as well as beer on a daily basis. However, he does not consider beer alcohol infrequently minimizes his use. The patient was at [facility] on 05/22/2014. Because of his medical instability, the patient was sent to the emergency department. The patient's wife states that the patient may have been using more oxycodone than prescribed. Since admission, the patient was placed on alcohol withdrawal protocol. The patient's agitation and aggressive behavior has improved although he remains confused at this point.  Assessment/Plan: Acute encephalopathy - Multifactorial due to combination of narcotic overuse, alcohol abuse, Also questionable hepatic encephalopathy, hypercalcemia and hyponatremia and hepatic encephalopathy. - B12, RBC folate, TSH and RPR are unremarkable. U/Anegative for pyuria, UDS was negative for drugs. - He was started on admission on thiamine, rifaximin and lactulose. - I will reduce narcotics.  -Acute confusional state:  At this point he should not be withdrawing from alcohol. It is likely that he has gone into acute confusional state.  - We'll start him on Seroquel orally.  Alcohol dependence/alcohol withdrawal: - Started on CIWA on admission.  Hypercalcemia: - Treated with IV fluids initially and was improved. Now is trending up. - PTH was 24, Check rPTH - As per wife patient had a screening colonoscopy about 5 years ago which was negative. - Urinary calcium to creatinine ratio was 0.01 most likely compatible with familiar hypercalcemia. - Vitamin levels within normal limits.  Liver lesion: Previous CT scan with contrast suggested hemangioma, alpha-fetoprotein was greater than  3000. Wife aware we'll try to get an MRI to better define the lesion. Patient has to be laying still for the MRI and due to his confusion were unable to do this at this point. We'll try to control his confusion and agitation before proceeding with MRI.  Chronic thrombocytopenia: Likely due to alcohol abuse has been stable.  Hyponatremia: Likely a combination with volume depletion and his cirrhosis. Has not resolved.  Chronic back pain: - have d/c tramadol  Cirrhosis: Likely due to alcohol and hepatitis C. GI was consulted checking an HCV RNA.   Code Status: full Family Communication: wife  Disposition Plan: likely need SNF in 3-4 days   Consultants:  GI  Procedures:  MRI brain  MRI liver pending  Antibiotics:  Rifaximin  HPI/Subjective: Patient relates he is hungry today has no new complaints.  Objective: Filed Vitals:   05/30/14 1343 05/31/14 0008 05/31/14 0557 05/31/14 0900  BP: 141/83 144/86 154/72   Pulse: 91 92 93   Temp: 98.1 F (36.7 C)  98.4 F (36.9 C)   TempSrc: Oral  Oral   Resp: 16 20 18    Height:      Weight:    97.841 kg (215 lb 11.2 oz)  SpO2: 95% 94% 93%     Intake/Output Summary (Last 24 hours) at 05/31/14 1028 Last data filed at 05/31/14 0942  Gross per 24 hour  Intake    360 ml  Output    677 ml  Net   -317 ml   Filed Weights   05/22/14 1712 05/31/14 0900  Weight: 97.523 kg (215 lb) 97.841 kg (215 lb 11.2 oz)    Exam:  General: Alert, awake, oriented x3, in no acute distress.  HEENT: No bruits, no goiter.  Heart: Regular rate  and rhythm.  Lungs: Good air movement, clear Abdomen: Soft, nontender, nondistended, positive bowel sounds.  Neuro: Grossly intact, nonfocal.   Data Reviewed: Basic Metabolic Panel:  Recent Labs Lab 05/26/14 0534 05/27/14 0540 05/28/14 0538 05/29/14 0550 05/31/14 0620  NA 135 132* 135 136 140  K 3.9 3.8 3.8 3.8 3.7  CL 108 106 108 108 110  CO2 25 23 24 24 26   GLUCOSE 110* 119* 112* 104*  123*  BUN 10 9 9 10 10   CREATININE 0.74 0.68 0.70 0.66 0.52  CALCIUM 10.1 9.5 9.9 10.1 11.0*   Liver Function Tests:  Recent Labs Lab 05/26/14 0534 05/27/14 0540 05/28/14 0538 05/29/14 0550 05/31/14 0620  AST 73* 65* 67* 61* 52*  ALT 63* 58* 63* 61* 55*  ALKPHOS 121* 115 129* 126* 124*  BILITOT 2.5* 2.0* 2.4* 2.5* 2.1*  PROT 5.8* 5.6* 5.9* 5.8* 5.8*  ALBUMIN 2.3* 2.1* 2.3* 2.1* 2.2*   No results for input(s): LIPASE, AMYLASE in the last 168 hours.  Recent Labs Lab 05/29/14 0550 05/31/14 0620  AMMONIA 24 30   CBC:  Recent Labs Lab 05/27/14 0540 05/31/14 0620  WBC 4.8 4.3  HGB 11.6* 11.5*  HCT 36.5* 37.5*  MCV 99.7 99.5  PLT 75* 91*   Cardiac Enzymes: No results for input(s): CKTOTAL, CKMB, CKMBINDEX, TROPONINI in the last 168 hours. BNP (last 3 results) No results for input(s): BNP in the last 8760 hours.  ProBNP (last 3 results) No results for input(s): PROBNP in the last 8760 hours.  CBG: No results for input(s): GLUCAP in the last 168 hours.  Recent Results (from the past 240 hour(s))  Urine culture     Status: None   Collection Time: 05/23/14  9:58 AM  Result Value Ref Range Status   Specimen Description URINE, CLEAN CATCH  Final   Special Requests NONE  Final   Colony Count   Final    7,000 COLONIES/ML Performed at [facility]    Culture   Final    INSIGNIFICANT GROWTH Performed at [facility]    Report Status 05/24/2014 FINAL  Final     Studies: No results found.  Scheduled Meds: . folic acid  1 mg Oral Daily  . lactulose  20 g Oral Daily  . LORazepam  1 mg Intravenous 30 min Pre-Op  . multivitamin with minerals  1 tablet Oral Daily  . QUEtiapine  25 mg Oral BID  . rifaximin  550 mg Oral BID  . sodium chloride  3 mL Intravenous Q12H  . thiamine (VITAMIN B1) IVPB  250 mg Intravenous Q24H   Continuous Infusions: . sodium chloride 10 mL/hr at 05/29/14 1833    Time Spent: [place], Arav Bannister  [facility] Pager [phone]. If 7PM-7AM, please contact night-coverage at [email or web address], password [facility] 05/31/2014, 10:28 AM  LOS: 9 days

## 2014-05-31 NOTE — Progress Notes (Signed)
CARE MANAGEMENT NOTE 05/31/2014  Patient:  Thomas Navarro, Thomas Navarro   Account Number:  [id number]  Date Initiated:  05/28/2014  Documentation initiated by:  Edwyna Shell  Subjective/Objective Assessment:   64 yo male admitted with acute encephalopathy secondary to ETOH withdrawal     Action/Plan:   discharge planning   Anticipated DC Date:  05/30/2014   Anticipated DC Plan:  [place] referral  Clinical Social Worker      DC Planning Services  CM consult      Hospital [place] Choice  HOME HEALTH   Choice offered to / List presented to:  C-3 Spouse           Status of service:  In process, will continue to follow Medicare Important Message given?   (If response is "NO", the following Medicare IM given date fields will be blank) Date Medicare IM given:   Medicare IM given by:   Date Additional Medicare IM given:   Additional Medicare IM given by:    Discharge Disposition:  [place]  Per UR Regulation:  Reviewed for med. necessity/level of care/duration of stay  If discussed at [place] of Stay Meetings, dates discussed:    Comments:  05/31/14 [place] [phone] Provided patient spouse with the cost of the Rifaximin with insurance coverage which is a $50 copay and provided a supplemental coupon which should put the copay for Rifaximin at $0 per month.  05/30/14 Charise Killian RN BSN CM [phone] Spoke with patient spouse regarding PT recommendation for SNF. She stated that she does not want the patient going to a SNF and plans on taking him home. She is agreeable to [facility] services and provided an agency choice list, also provided a list of private duty agencies for extra support if needed. Will continue to follow for dc needs.  05/28/14 Charise Killian RN BSN CM [phone] Spoke with patient spouse and she stated that her preference if for patient to be discharged and go directly to [facility] if he is eligible. Spouse is in contact  with [facility] and [facility] CSW. She stated that the patient was living at home indepdently prior to hospitalization. Will continue to follow.

## 2014-05-31 NOTE — Progress Notes (Signed)
Pt c/o chest pain but pointing to pain in the abdomen. EKG done - NSR w/ Right BBB. Notified MD. New order for protonix placed.

## 2014-05-31 NOTE — Progress Notes (Signed)
Physical Therapy Treatment Patient Details Name: Thomas Navarro MRN: [id number] DOB: 06-Jan-1951 Today's Date: 05/31/2014    History of Present Illness Thomas Navarro is a 64 y.o. male with a past medical history of chronic back pain on narcotic analgesics for the past year, alcohol abuse, presenting to the emergency department 05/22/14 after attempting to check himself into [facility] and was referred to [facility] instead with complaints of mental status changes per wife for several days.    PT Comments    Pt in 4 point restraints and sitter in room.  Removed restraints and assisted pt OOB to [facility] required 100% VC's for direction and turn completion. Pt impulsive, impaired cognition and difficulty following commands.  Very unsteady gait.  Required + 2 HHA and was unable to functionally use a RW.  HIGH FALL RISK.    Follow Up Recommendations  SNF  spouse verbalized she is thinking about taking pt home and wants to wait to see if med change helps   Equipment Recommendations       Recommendations for Other Services       Precautions / Restrictions Precautions Precautions: Fall    Mobility  Bed Mobility Overal bed mobility: Needs Assistance;+ 2 for safety/equipment Bed Mobility: Supine to Sit     Supine to sit: +2 for safety/equipment;Min assist     General bed mobility comments: increased time and repeat simple VC's to maintain focus.   Transfers Overall transfer level: Needs assistance Equipment used: Rolling walker (2 wheeled);None Transfers: Sit to/from Stand Sit to Stand: +2 safety/equipment;Mod assist         General transfer comment: pt required 100% repeat VC's on direction, turn completion and hand placemwent to staedy self.  Ambulation/Gait Ambulation/Gait assistance: +2 physical assistance;Mod assist Ambulation Distance (Feet): 125 Feet Assistive device: Rolling walker (2 wheeled) Gait Pattern/deviations: Step-to pattern;Step-through pattern;Decreased  stride length;Shuffle Gait velocity: decreased   General Gait Details: initially used RW + 2 assist however was unable to coordinate and ase AD safely/correctly so remove and amb + 2 HHA.  Very drunken/unsteady gait.  Impaired safety cognition.  HIGH FALL RISK.     Stairs            Wheelchair Mobility    Modified Rankin (Stroke Patients Only)       Balance                                    Cognition Arousal/Alertness: Awake/alert Behavior During Therapy: Restless;Impulsive Overall Cognitive Status: Impaired/Different from baseline Area of Impairment: Orientation;Attention;Memory;Following commands;Safety/judgement;Awareness;Problem solving Orientation Level: Person Current Attention Level: Alternating   Following Commands: Follows one step commands inconsistently Safety/Judgement: Decreased awareness of safety;Decreased awareness of deficits   Problem Solving: Slow processing      Exercises      General Comments        Pertinent Vitals/Pain Pain Assessment: No/denies pain    Home Living                      Prior Function            PT Goals (current goals can now be found in the care plan section) Progress towards PT goals: Progressing toward goals    Frequency  Min 3X/week    PT Plan      Co-evaluation             End of Session Equipment  Utilized During Treatment: Gait belt Activity Tolerance: Treatment limited secondary to medical complications (Comment) Patient left: in chair;with call bell/phone within reach;with family/visitor present;with nursing/sitter in room     Time: 1035-1100 PT Time Calculation (min) (ACUTE ONLY): 25 min  Charges:  $Gait Training: 8-22 mins $Therapeutic Activity: 8-22 mins                    G Codes:      Rica Koyanagi  PTA [facility]  Acute  Rehab Pager      [phone]

## 2014-05-31 NOTE — Plan of Care (Signed)
Problem: Phase II Progression Outcomes Goal: Discharge plan established Outcome: Progressing SNF reccommended.

## 2014-05-31 NOTE — Progress Notes (Signed)
Pt started trying to roll out of the bed.  Safety sitter in the room attempted multiple times to get him to calm down.  Pt became very aggressive.  Stating that he was going to 'punch all your teeth out' and 'turn your cheek inwards'. Tried to talk to pt calmly and explain time of night and figure out why pt was so upset. Pt would only respond with physical threats and 'go get your daddy'. Pt began kicking, punching, spitting, and scratching at staff. Ativan not due until 0400. MDs note states to limit sedation medications as much as possible.  Asked NP for 4 point restraints due to violent behavior. Order given. Pt put in 4 point restraints. Will continue to monitor.  Roselind Rily

## 2014-05-31 NOTE — Progress Notes (Signed)
Pt continues using fowl language and threatening sitter. He attempts to get out of his restraints and to kick the table over. Restraints are rechecked. Safety sitter is at his bedside. Will continue to monitor.

## 2014-05-31 NOTE — Progress Notes (Signed)
     [facility] Progress Note  Subjective:  In 4 pt restraints, calm, confused. Knows name but thinks it is 64, Levester Fresh is president, and he is home camping. Pt reported was aggressive last night. Pt had order for MR liver placed yesterday.Had frequent diarrhea yesterday, so lactulose was decreased.   Objective:  Vital signs in last 24 hours: Temp:  [98.1 F (36.7 C)-98.4 F (36.9 C)] 98.4 F (36.9 C) (04/22 0557) Pulse Rate:  [91-93] 93 (04/22 0557) Resp:  [16-20] 18 (04/22 0557) BP: (141-154)/(72-86) 154/72 mmHg (04/22 0557) SpO2:  [93 %-95 %] 93 % (04/22 0557) Weight:  [215 lb 11.2 oz (97.841 kg)] 215 lb 11.2 oz (97.841 kg) (04/22 0900) Last BM Date: 05/30/14 General:   Alert, confused, in 4 point restraints Heart:  Regular rate and rhythm; no murmurs Pulm;lungs clear Abdomen:  Soft, nontender and nondistended. Normal bowel sounds, without guarding, and without rebound.   Extremities:  Without edema. Neurologic:Alert, oriented x 1 Intake/Output from previous day: 04/21 0701 - 04/22 0700 In: 100 [P.O.:100] Out: 377 [Urine:375; Stool:2] Intake/Output this shift: Total I/O In: 360 [P.O.:360] Out: 300 [Urine:300]  Lab Results:  Recent Labs  05/31/14 0620  WBC 4.3  HGB 11.5*  HCT 37.5*  PLT 91*   BMET  Recent Labs  05/29/14 0550 05/31/14 0620  NA 136 140  K 3.8 3.7  CL 108 110  CO2 24 26  GLUCOSE 104* 123*  BUN 10 10  CREATININE 0.66 0.52  CALCIUM 10.1 11.0*   LFT  Recent Labs  05/31/14 0620  PROT 5.8*  ALBUMIN 2.2*  AST 52*  ALT 55*  ALKPHOS 124*  BILITOT 2.1*   Ammonia 30, was 24 yesterday  Hepatitis Panel  Recent Labs  05/29/14 1515  HEPBSAG NEGATIVE  HCVAB Reactive*      ASSESSMENT/PLAN:   56. 64 year old male with cirrhosis / altered mental status / liver lesions. Confusion may be multifactorial (medications, ETOH withdrawal, hepatic encephalopathy). He does have mild asterixis on exam. Liver lesion seen  on CTscan 2 years ago but patient didn't get a follow up MRI. Now with multiple indeterminate liver lesions on ultrasound and an AFP of 3,000. ETOH likely etiology of cirrhosis, but Hep C antibody positive--will check viral load and genotype.Hep B surface antigen neg. Has MRI ordered to eval for HCC--but doubt pt will be able to cooperate with test. Will review with MRI--? Doing test at [facility] with anesthesia.    LOS: 9 days   Exavior Kimmons, Deloris Ping 05/31/2014, Pager [phone]  Reviewed pt with Dr Tonny Branch no t able tom complete MRI at this time without sedation. Pt has been started on seroquel. Will reeval over next 2 days, and if he does not appear able to tolerate/participate with MRI--will reoerder MRI liver with anesthesia to be done at [facility].

## 2014-06-01 LAB — BASIC METABOLIC PANEL
ANION GAP: 3 — AB (ref 5–15)
BUN: 9 mg/dL (ref 6–23)
CALCIUM: 10.7 mg/dL — AB (ref 8.4–10.5)
CHLORIDE: 109 mmol/L (ref 96–112)
CO2: 27 mmol/L (ref 19–32)
CREATININE: 0.61 mg/dL (ref 0.50–1.35)
GFR calc non Af Amer: >90 mL/min (ref >90)
Glucose, Bld: 118 mg/dL — ABNORMAL HIGH (ref 70–99)
Potassium: 4 mmol/L (ref 3.5–5.1)
Sodium: 139 mmol/L (ref 135–145)

## 2014-06-01 LAB — AMMONIA: Ammonia: 41 umol/L — ABNORMAL HIGH (ref 11–32)

## 2014-06-01 MED ORDER — SODIUM CHLORIDE 0.9 % IV SOLN
INTRAVENOUS | Status: DC
  Administered 2014-06-02: 13:00:00 via INTRAVENOUS

## 2014-06-01 MED ORDER — OXYCODONE HCL 5 MG PO TABS
10.0000 mg | ORAL_TABLET | Freq: Three times a day (TID) | ORAL | Status: DC | PRN
  Administered 2014-06-01 (×2): 10 mg via ORAL
  Filled 2014-06-01 (×2): qty 2

## 2014-06-01 MED ORDER — SODIUM CHLORIDE 0.9 % IV SOLN
INTRAVENOUS | Status: AC
  Administered 2014-06-01 (×2): via INTRAVENOUS

## 2014-06-01 MED ORDER — QUETIAPINE FUMARATE 50 MG PO TABS
50.0000 mg | ORAL_TABLET | Freq: Two times a day (BID) | ORAL | Status: DC
  Administered 2014-06-01 – 2014-06-02 (×4): 50 mg via ORAL
  Filled 2014-06-01 (×6): qty 1

## 2014-06-01 MED ORDER — FUROSEMIDE 10 MG/ML IJ SOLN
20.0000 mg | Freq: Once | INTRAMUSCULAR | Status: AC
  Administered 2014-06-01: 20 mg via INTRAVENOUS
  Filled 2014-06-01: qty 2

## 2014-06-01 MED ORDER — HALOPERIDOL LACTATE 5 MG/ML IJ SOLN: 2.0000 mg | Freq: Four times a day (QID) | INTRAMUSCULAR | Status: DC | PRN

## 2014-06-01 NOTE — Progress Notes (Signed)
Pt is calmer at this time. Restraints assessed. No signs of distress or trauma at restraint sites at this time. Pt is confused and insists that he needs to leave. Sitter at bedside. Will continue to monitor.

## 2014-06-01 NOTE — Progress Notes (Addendum)
Pt resting with eyes close with a calm manor and no signs of distress at this time. Sitter at bedside. Right hand restraint removed. No injury noted to restraint sites. Will continue to monitor.

## 2014-06-01 NOTE — Progress Notes (Signed)
[facility] PROGRESS NOTE Interim History: 64 year old male with a history of chronic back pain on chronic opioids, alcohol dependence, alcoholic liver disease presented with 4-5 day history of worsening confusion. Apparently, the patient drinks approximately 1/5 of liquor every week as well as beer on a daily basis. However, he does not consider beer alcohol infrequently minimizes his use. The patient was at [facility] on 05/22/2014. Because of his medical instability, the patient was sent to the emergency department. The patient's wife states that the patient may have been using more oxycodone than prescribed. Since admission, the patient was placed on alcohol withdrawal protocol. The patient's agitation and aggressive behavior has improved although he remains confused at this point.  Assessment/Plan: Acute encephalopathy - Multifactorial due to combination of narcotic overuse, alcohol abuse, hepatic encephalopathy, +/- hypercalcemia and hyponatremia. - B12, RBC folate, TSH and RPR are unremarkable. U/Anegative for pyuria, UDS was negative for drugs. - He was started on admission on thiamine, rifaximin and lactulose. - MRI brain on 4.19.2016: No acute intracranial process   Acute confusional state: - At this point he should not be withdrawing from alcohol. It is likely that he has gone into acute confusional state.  - Increase Seroquel. Add haldol PRN. - d/c ativan and robitussin. - open window during the day and close at night.  Alcohol dependence/alcohol withdrawal: - Started on CIWA on admission.  Hypercalcemia: - Treated with IV fluids initially and was improved. Now is trending up. Start IV fluids and lasix for Hypercalcemia. - PTH was 24, PTHr peptide is pending. - As per wife patient had a screening colonoscopy about 5 years ago which was negative. - Urinary calcium to creatinine ratio was 0.01 most likely compatible with familiar hypercalcemia. - Vitamin levels  within normal limits. - He is overloaded on physical exam.  Liver lesion: - Previous CT scan with contrast suggested hemangioma, alpha-fetoprotein was greater than 3000. - Wife aware we'll try to get an MRI to better define the lesion. - Patient has to be laying still for the MRI and due to his confusion were unable to do this at this point. We'll try to control his confusion and agitation before proceeding with MRI.  Chronic thrombocytopenia: - Likely due to alcohol abuse has been stable.  Hyponatremia: Likely a combination with volume depletion and his cirrhosis. Has not resolved.  Chronic back pain: - have d/c tramadol  Cirrhosis: Likely due to alcohol and hepatitis C. GI was consulted checking an HCV RNA.   Code Status: full Family Communication: wife  Disposition Plan: likely need SNF in 3-4 days   Consultants:  GI  Procedures:  MRI brain  MRI liver pending  Antibiotics:  Rifaximin  HPI/Subjective: Mentation has been fluctuating throughout the day from what he is at home at baseline to confusion with aggression and hostility.  Objective: Filed Vitals:   05/31/14 1453 05/31/14 2130 05/31/14 2200 06/01/14 0530  BP: 154/67  150/74 146/64  Pulse: 99 96  89  Temp: 98.6 F (37 C)  99.5 F (37.5 C) 99.3 F (37.4 C)  TempSrc: Oral  Oral Oral  Resp: 16 20  20   Height:      Weight:      SpO2: 92% 93%  91%    Intake/Output Summary (Last 24 hours) at 06/01/14 0843 Last data filed at 06/01/14 0538  Gross per 24 hour  Intake    390 ml  Output    250 ml  Net    140 ml  Filed Weights   05/22/14 1712 05/31/14 0900  Weight: 97.523 kg (215 lb) 97.841 kg (215 lb 11.2 oz)    Exam:  General: Alert, awake, oriented x2, in no acute distress.  HEENT: No bruits, no goiter.  Heart: Regular rate and rhythm. 3+ edema. Lungs: Good air movement, clear Abdomen: Soft, nontender, nondistended, positive bowel sounds.  Neuro: Grossly intact, nonfocal.   Data  Reviewed: Basic Metabolic Panel:  Recent Labs Lab 05/26/14 0534 05/27/14 0540 05/28/14 0538 05/29/14 0550 05/31/14 0620  NA 135 132* 135 136 140  K 3.9 3.8 3.8 3.8 3.7  CL 108 106 108 108 110  CO2 25 23 24 24 26   GLUCOSE 110* 119* 112* 104* 123*  BUN 10 9 9 10 10   CREATININE 0.74 0.68 0.70 0.66 0.52  CALCIUM 10.1 9.5 9.9 10.1 11.0*   Liver Function Tests:  Recent Labs Lab 05/26/14 0534 05/27/14 0540 05/28/14 0538 05/29/14 0550 05/31/14 0620  AST 73* 65* 67* 61* 52*  ALT 63* 58* 63* 61* 55*  ALKPHOS 121* 115 129* 126* 124*  BILITOT 2.5* 2.0* 2.4* 2.5* 2.1*  PROT 5.8* 5.6* 5.9* 5.8* 5.8*  ALBUMIN 2.3* 2.1* 2.3* 2.1* 2.2*   No results for input(s): LIPASE, AMYLASE in the last 168 hours.  Recent Labs Lab 05/29/14 0550 05/31/14 0620 06/01/14 0530  AMMONIA 24 30 41*   CBC:  Recent Labs Lab 05/27/14 0540 05/31/14 0620  WBC 4.8 4.3  HGB 11.6* 11.5*  HCT 36.5* 37.5*  MCV 99.7 99.5  PLT 75* 91*   Cardiac Enzymes: No results for input(s): CKTOTAL, CKMB, CKMBINDEX, TROPONINI in the last 168 hours. BNP (last 3 results) No results for input(s): BNP in the last 8760 hours.  ProBNP (last 3 results) No results for input(s): PROBNP in the last 8760 hours.  CBG: No results for input(s): GLUCAP in the last 168 hours.  Recent Results (from the past 240 hour(s))  Urine culture     Status: None   Collection Time: 05/23/14  9:58 AM  Result Value Ref Range Status   Specimen Description URINE, CLEAN CATCH  Final   Special Requests NONE  Final   Colony Count   Final    7,000 COLONIES/ML Performed at [facility]    Culture   Final    INSIGNIFICANT GROWTH Performed at [facility]    Report Status 05/24/2014 FINAL  Final     Studies: No results found.  Scheduled Meds: . folic acid  1 mg Oral Daily  . lactulose  20 g Oral Daily  . multivitamin with minerals  1 tablet Oral Daily  . pantoprazole  40 mg Oral BID  . QUEtiapine  25 mg Oral BID   . rifaximin  550 mg Oral BID  . sodium chloride  3 mL Intravenous Q12H   Continuous Infusions: . sodium chloride 10 mL/hr at 05/29/14 1833    Time Spent: [place], Madyn Ivins  [facility] Pager [phone]. If 7PM-7AM, please contact night-coverage at [email or web address], password [facility] 06/01/2014, 8:43 AM  LOS: 10 days

## 2014-06-02 DIAGNOSIS — F05 Delirium due to known physiological condition: Secondary | ICD-10-CM | POA: Insufficient documentation

## 2014-06-02 LAB — HEPATIC FUNCTION PANEL
ALT: 49 U/L (ref 0–53)
AST: 57 U/L — ABNORMAL HIGH (ref 0–37)
Albumin: 2.1 g/dL — ABNORMAL LOW (ref 3.5–5.2)
Alkaline Phosphatase: 117 U/L (ref 39–117)
BILIRUBIN INDIRECT: 0.8 mg/dL (ref 0.3–0.9)
Bilirubin, Direct: 0.8 mg/dL — ABNORMAL HIGH (ref 0.0–0.5)
Total Bilirubin: 1.6 mg/dL — ABNORMAL HIGH (ref 0.3–1.2)
Total Protein: 5.8 g/dL — ABNORMAL LOW (ref 6.0–8.3)

## 2014-06-02 LAB — BASIC METABOLIC PANEL
Anion gap: 3 — ABNORMAL LOW (ref 5–15)
BUN: 9 mg/dL (ref 6–23)
CO2: 29 mmol/L (ref 19–32)
Calcium: 10.7 mg/dL — ABNORMAL HIGH (ref 8.4–10.5)
Chloride: 107 mmol/L (ref 96–112)
Creatinine, Ser: 0.73 mg/dL (ref 0.50–1.35)
GFR calc Af Amer: >90 mL/min (ref >90)
GFR calc non Af Amer: >90 mL/min (ref >90)
GLUCOSE: 118 mg/dL — AB (ref 70–99)
Potassium: 3.9 mmol/L (ref 3.5–5.1)
Sodium: 139 mmol/L (ref 135–145)

## 2014-06-02 MED ORDER — SPIRONOLACTONE 100 MG PO TABS
100.0000 mg | ORAL_TABLET | Freq: Every day | ORAL | Status: DC
Start: 2014-06-02 — End: 2014-06-07
  Administered 2014-06-02 – 2014-06-07 (×6): 100 mg via ORAL
  Filled 2014-06-02 (×7): qty 1

## 2014-06-02 MED ORDER — LIDOCAINE 5 % EX PTCH
1.0000 | MEDICATED_PATCH | CUTANEOUS | Status: DC
  Administered 2014-06-02 – 2014-06-07 (×6): 1 via TRANSDERMAL
  Filled 2014-06-02 (×6): qty 1

## 2014-06-02 MED ORDER — OXYCODONE HCL 5 MG PO TABS
5.0000 mg | ORAL_TABLET | Freq: Three times a day (TID) | ORAL | Status: DC | PRN
  Administered 2014-06-02 – 2014-06-04 (×3): 5 mg via ORAL
  Filled 2014-06-02 (×3): qty 1

## 2014-06-02 MED ORDER — LACTULOSE 10 GM/15ML PO SOLN
30.0000 g | Freq: Two times a day (BID) | ORAL | Status: DC
  Administered 2014-06-02 – 2014-06-03 (×3): 30 g via ORAL
  Filled 2014-06-02 (×5): qty 45

## 2014-06-02 MED ORDER — HALOPERIDOL LACTATE 5 MG/ML IJ SOLN
1.0000 mg | Freq: Four times a day (QID) | INTRAMUSCULAR | Status: DC | PRN
  Administered 2014-06-04: 1 mg via INTRAVENOUS
  Filled 2014-06-02: qty 1

## 2014-06-02 MED ORDER — FUROSEMIDE 40 MG PO TABS
40.0000 mg | ORAL_TABLET | Freq: Every day | ORAL | Status: DC
  Administered 2014-06-02 – 2014-06-07 (×6): 40 mg via ORAL
  Filled 2014-06-02 (×7): qty 1

## 2014-06-02 NOTE — Consult Note (Signed)
Reason for Consult: AMS / encephalopathy Referring Physician: Dr Algis Liming  CC: AMS  HPI: Thomas Navarro is a 64 y.o. male with a history of cirrhosis, hepatitis C, substance abuse, and chronic back pain, who was admitted to was a long hospital on 05/22/2014 for evaluation of confusion. The patient was unable to give any history whatsoever today therefore the history was obtained from the chart and a telephone call to the patient's wife. Mrs. 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 Approximately 4-5 days before this most recent admission the patient seemed somewhat disoriented and was not able to answer questions appropriately. His wife reports that she had never known him be confused before except when intoxicated. The patient takes oxycodone for his back pain. Recently he has been taking more than the prescribed amount. Over the past several years he had confined his drinking to beer until the weeks prior to admission when he began to drink liquor again. He drank while taking the oxycodone.   Since admission the patient has become progressively confused. He always seems to recognize his wife but recently has not recognized pictures of his children. A CT scan of the head was performed at time of admission which was unremarkable. 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 He has had elevated liver function tests as well as an elevated ammonia level which was 41 yesterday. An ultrasound of the liver revealed some determinant lesions. An MRI has been planned although it was not felt that the patient would tolerate the study at this time. He currently has a Actuary and at times is uncooperative. He has been receiving Seroquel  50 mg twice a day and Haldol 1 mg IV every 6 hours as needed for agitation. He is on lactulose and spironolactone for his liver disease. Neurology has been asked to help in the evaluation of his confusion .   Past Medical History  Diagnosis Date  . Chronic back pain   . PONV (postoperative nausea and vomiting)   Renal calculi Cirrhosis Hepatitis C  Lower extremity edema 1 year Adrenal nodule  Past Surgical History  Procedure Laterality Date  . Back surgery      Social History:  reports that he has never smoked. He has never used smokeless tobacco. He reports that he drinks about 3.0 oz of alcohol per week. He reports that he uses illicit drugs (Oxycodone) about 28 times per week. 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 He has been abusing alcohol for at least 30 years quitting to his wife.  Family History: His father died at age 71 with coronary artery disease. His mother died in her 67s with breast cancer. He has siblings who abuse alcohol.  Allergies  Allergen Reactions  . Morphine And Related Nausea And Vomiting    Medications:  Scheduled: . folic acid  1 mg Oral Daily  . furosemide  40 mg Oral Daily  . lactulose  30 g Oral BID  . lidocaine  1 patch Transdermal Q24H  . multivitamin with minerals  1 tablet Oral Daily  . pantoprazole  40 mg Oral BID  . QUEtiapine  50 mg Oral BID  . rifaximin  550  mg Oral BID  . sodium chloride  3 mL Intravenous Q12H  . spironolactone  100 mg Oral Daily    ROS: History obtained from unobtainable from patient due to mental status Some information was obtained from patient's wife.  General ROS: negative for - chills, fatigue, fever, night sweats, weight gain or weight loss. Recently had decreased appetite. He has been sleeping more than usual. Previously had been very active. Psychological ROS: negative for - behavioral disorder, hallucinations, memory difficulties, mood swings or suicidal ideation Ophthalmic ROS:  negative for - blurry vision, double vision, eye pain or loss of vision ENT ROS: negative for - epistaxis, nasal discharge, oral lesions, sore throat, tinnitus or vertigo Allergy and Immunology ROS: negative for - hives or itchy/watery eyes Hematological and Lymphatic ROS: negative for - bleeding problems, bruising or swollen lymph nodes Endocrine ROS: negative for - galactorrhea, hair pattern changes, polydipsia/polyuria or temperature intolerance Respiratory ROS: negative for - hemoptysis, shortness of breath or wheezing. Congested cough nonproductive. Cardiovascular ROS: negative for - chest pain, dyspnea on exertion, or irregular heartbeat. Lower extremity edema Gastrointestinal ROS: negative for - abdominal pain, diarrhea, hematemesis, nausea/vomiting or stool incontinence Genito-Urinary ROS: negative for - dysuria, hematuria, incontinence or urinary frequency/urgency Musculoskeletal ROS: negative for - joint swelling or muscular weakness Neurological ROS: as noted in HPI Dermatological ROS: negative for rash and skin lesion changes   Physical Examination: Blood pressure 155/80, pulse 110, temperature 99.6 F (37.6 C), temperature source Oral, resp. rate 19, height 5\' 11"  (1.803 m), weight 101.923 kg (224 lb 11.2 oz), SpO2 92 %.  Limited exam compared to the patient's refusal/inability to cooperate. Mildly agitated. General - 64 year old male. Uncooperative. Heart - Regular rate and rhythm - no murmer appreciated Lungs - poor inspiratory effort Abdomen - not able to evaluate Extremities - 2-3 + pitting edema bilaterally Skin - Warm and dry Appears mildly jaundiced   Neurologic Examination Mental Status: Alert but confused and very dysarthric. Has difficulty following other than simple commands. Uncooperative. Cranial Nerves: II: Discs not visualized; Visual fields could not be evaluated, pupils equal, round, reactive to light and accommodation III,IV, VI: ptosis not present,  extra-ocular motions intact bilaterally with max cueing V,VII: smile symmetric, facial light touch sensation normal bilaterally VIII: hearing normal bilaterally IX,X: gag reflex could not be tested XI: bilateral shoulder shrug cannot be tested XII: midline tongue extension Motor: Moves all extremities. Grip strength 3 over 5 bilaterally. Tone and bulk:normal tone throughout; no atrophy noted Sensory: Could not be tested Deep Tendon Reflexes: Could not be tested Plantars: Right: downgoing   Left: downgoing Cerebellar: Patient unable to follow commands for testing Gait: The patient is restrained and confined to bed   Laboratory Studies:   Basic Metabolic Panel:  Recent Labs Lab 05/28/14 0538 05/29/14 0550 05/31/14 0620 06/01/14 0530 06/02/14 0525  NA 135 136 140 139 139  K 3.8 3.8 3.7 4.0 3.9  CL 108 108 110 109 107  CO2 24 24 26 27 29   GLUCOSE 112* 104* 123* 118* 118*  BUN 9 10 10 9 9   CREATININE 0.70 0.66 0.52 0.61 0.73  CALCIUM 9.9 10.1 11.0* 10.7* 10.7*    Liver Function Tests:  Recent Labs Lab 05/27/14 0540 05/28/14 0538 05/29/14 0550 05/31/14 0620 06/02/14 0525  AST 65* 67* 61* 52* 57*  ALT 58* 63* 61* 55* 49  ALKPHOS 115 129* 126* 124* 117  BILITOT 2.0* 2.4* 2.5* 2.1* 1.6*  PROT 5.6* 5.9* 5.8* 5.8* 5.8*  ALBUMIN 2.1* 2.3*  2.1* 2.2* 2.1*   No results for input(s): LIPASE, AMYLASE in the last 168 hours.  Recent Labs Lab 05/29/14 0550 05/31/14 0620 06/01/14 0530  AMMONIA 24 30 41*    CBC:  Recent Labs Lab 05/27/14 0540 05/31/14 0620  WBC 4.8 4.3  HGB 11.6* 11.5*  HCT 36.5* 37.5*  MCV 99.7 99.5  PLT 75* 91*    Cardiac Enzymes: No results for input(s): CKTOTAL, CKMB, CKMBINDEX, TROPONINI in the last 168 hours.  BNP: Invalid input(s): POCBNP  CBG: No results for input(s): GLUCAP in the last 168 hours.  Microbiology: Results for orders placed or performed during the hospital encounter of 05/22/14  Urine culture     Status: None    Collection Time: 05/23/14  9:58 AM  Result Value Ref Range Status   Specimen Description URINE, CLEAN CATCH  Final   Special Requests NONE  Final   Colony Count   Final    7,000 COLONIES/ML Performed at [facility]    Culture   Final    INSIGNIFICANT GROWTH Performed at [facility]    Report Status 05/24/2014 FINAL  Final    Coagulation Studies: No results for input(s): LABPROT, INR in the last 72 hours.  Urinalysis: No results for input(s): COLORURINE, LABSPEC, PHURINE, GLUCOSEU, HGBUR, BILIRUBINUR, KETONESUR, PROTEINUR, UROBILINOGEN, NITRITE, LEUKOCYTESUR in the last 168 hours.  Invalid input(s): APPERANCEUR  Lipid Panel:  No results found for: CHOL, TRIG, HDL, CHOLHDL, VLDL, LDLCALC  HgbA1C: No results found for: HGBA1C  Urine Drug Screen:      Component Value Date/Time   LABOPIA POSITIVE* 05/22/2014 1246   COCAINSCRNUR NONE DETECTED 05/22/2014 1246   LABBENZ POSITIVE* 05/22/2014 1246   AMPHETMU NONE DETECTED 05/22/2014 1246   THCU NONE DETECTED 05/22/2014 1246   LABBARB NONE DETECTED 05/22/2014 1246    Alcohol Level: No results for input(s): ETH in the last 168 hours.  Other results: EKG: Sinus rhythm rate 87 bpm with diffuse T-wave abnormalities. Please refer to the formal cardiology reading for complete details.  Imaging:   MRI head without contrast 05/28/2014 No acute intracranial process on this mildly motion degraded examination. Minimal white matter changes, less than expected for age could represent chronic small vessel ischemic disease.  Small perivascular space versus lacunar infarct LEFT thalamus.   CT head without contrast 05/22/2014 Normal for age non contrast CT appearance of the brain.  Mikey Bussing PA-C [facility] Pager [phone] 06/02/2014, 2:18 PM  Patient seen and examined.  Clinical course and management discussed.  Necessary edits performed.  I agree with the above.  Assessment and plan of care  developed and discussed below.    Assessment/Plan:   63 year old male admitted to [facility] for further evaluation of confusion with history of substance abuse, cirrhosis and hepatitis C.  Confusion has progressed since admission. Head CT and MRI of the brain have been personally reviewed and shows no acute changes.  Ammonia levels have been slowly increasing.  LFT's are abnormal.  Also complicating issue is patient's withdrawal.  Suspect mental status changes are metabolic in nature. Further work up recommended.  Recommendations: 1.  EEG 2.  Continue to address ammonia levels 3.  Agree with continued CIWA and vitamin replacement   Alexis Goodell, MD [facility] [phone]  06/02/2014  11:52 PM

## 2014-06-02 NOTE — Progress Notes (Signed)
[facility] PROGRESS NOTE Interim History: 64 year old male with a history of chronic back pain on chronic opioids, alcohol dependence, alcoholic liver disease presented with 4-5 day history of worsening confusion. Apparently, the patient drinks approximately 1/5 of liquor every week as well as beer on a daily basis. However, he does not consider beer alcohol infrequently minimizes his use. The patient was at [facility] on 05/22/2014. Because of his medical instability, the patient was sent to the emergency department. The patient's wife states that the patient may have been using more oxycodone than prescribed. Since admission, the patient was placed on alcohol withdrawal protocol. The patient's agitation and aggressive behavior has improved although he remains confused at this point.  Assessment/Plan: Acute encephalopathy - Multifactorial due to combination of narcotic overuse, alcohol abuse, hepatic encephalopathy, +/- hypercalcemia and hyponatremia. - B12, RBC folate, TSH and RPR are unremarkable. U/Anegative for pyuria, UDS was negative for drugs. - He was started on admission on thiamine, rifaximin and lactulose. - MRI brain on 4.19.2016: No acute intracranial process   Acute confusional state: - At this point he should not be withdrawing from alcohol. It is likely that he has gone into acute confusional state.  - Currently on Seroquel 50 MG twice a day (was increased on 4/23). May have to cut back on dose if remains somnolent. Reduce when necessary Haldol. - d/c'ed ativan and robitussin. - Minimize opioids. - Try to optimize sleep hygiene - GI follow-up appreciated: Suspect ongoing hepatic encephalopathy and adjusting dose of lactulose. Continue rifaximin. - Neurology consulted for assistance in evaluation of complicated patient.  Alcohol dependence/alcohol withdrawal: - Not in alcohol withdrawal any longer.  Hypercalcemia: - Treated with IV fluids initially and was  improved. Now is trending up. Start IV fluids and lasix for Hypercalcemia. - PTH was 24, PTHr peptide is pending. - As per wife patient had a screening colonoscopy about 5 years ago which was negative. - Urinary calcium to creatinine ratio was 0.01 most likely compatible with familiar hypercalcemia. - Vitamin levels within normal limits. - He is overloaded on physical exam. - Started diuretics 4/24  Liver lesion: - Previous CT scan with contrast suggested hemangioma, alpha-fetoprotein was greater than 3000. - Wife aware we'll try to get an MRI to better define the lesion. - MRI liver when patient able to cooperate for procedure.   Chronic thrombocytopenia: - Likely due to alcohol abuse has been stable.  Hyponatremia: Likely a combination with volume depletion and his cirrhosis. resolved  Chronic back pain: - have d/c tramadol  Cirrhosis: Likely due to alcohol and hepatitis C (HCV: [id number])   Code Status: Full Family Communication: discussed extensively with wife at bedside on 4/24   Disposition Plan: Not medically stable for discharge   Consultants:  GI  Procedures:  MRI brain  MRI liver pending  Antibiotics:  Rifaximin  HPI/Subjective: Patient was somnolent but easily arousable this morning. Continues to be confused but not agitated. As per nursing, 2 BMs in the last 24 hours.  Objective: Filed Vitals:   06/01/14 1819 06/01/14 2158 06/02/14 0529 06/02/14 1431  BP:  146/70 154/77 153/71  Pulse: 104 106 100 108  Temp: 98.6 F (37 C) 97.5 F (36.4 C) 99.7 F (37.6 C) 98 F (36.7 C)  TempSrc: Oral Oral Axillary Oral  Resp: 26 21 20 22   Height:      Weight:      SpO2: 95% 96% 95% 96%    Intake/Output Summary (Last 24 hours) at 06/02/14 1624  Last data filed at 06/02/14 1432  Gross per 24 hour  Intake   2250 ml  Output   1200 ml  Net   1050 ml   Filed Weights   05/22/14 1712 05/31/14 0900  Weight: 97.523 kg (215 lb) 97.841 kg (215 lb 11.2 oz)     Exam:  General: Middle aged male, chronically ill looking, lying comfortably supine in bed.  HEENT: No bruits, no goiter.  Heart: Regular rate and rhythm. 3+ edema in LE's. Lungs: Good air movement, clear Abdomen: Soft, nontender, nondistended, positive bowel sounds.  Neuro: Drowsy but arousable- drifts back to sleep. Oriented only to self. Asterixis +.  Data Reviewed: Basic Metabolic Panel:  Recent Labs Lab 05/28/14 0538 05/29/14 0550 05/31/14 0620 06/01/14 0530 06/02/14 0525  NA 135 136 140 139 139  K 3.8 3.8 3.7 4.0 3.9  CL 108 108 110 109 107  CO2 24 24 26 27 29   GLUCOSE 112* 104* 123* 118* 118*  BUN 9 10 10 9 9   CREATININE 0.70 0.66 0.52 0.61 0.73  CALCIUM 9.9 10.1 11.0* 10.7* 10.7*   Liver Function Tests:  Recent Labs Lab 05/27/14 0540 05/28/14 0538 05/29/14 0550 05/31/14 0620 06/02/14 0525  AST 65* 67* 61* 52* 57*  ALT 58* 63* 61* 55* 49  ALKPHOS 115 129* 126* 124* 117  BILITOT 2.0* 2.4* 2.5* 2.1* 1.6*  PROT 5.6* 5.9* 5.8* 5.8* 5.8*  ALBUMIN 2.1* 2.3* 2.1* 2.2* 2.1*   No results for input(s): LIPASE, AMYLASE in the last 168 hours.  Recent Labs Lab 05/29/14 0550 05/31/14 0620 06/01/14 0530  AMMONIA 24 30 41*   CBC:  Recent Labs Lab 05/27/14 0540 05/31/14 0620  WBC 4.8 4.3  HGB 11.6* 11.5*  HCT 36.5* 37.5*  MCV 99.7 99.5  PLT 75* 91*   Cardiac Enzymes: No results for input(s): CKTOTAL, CKMB, CKMBINDEX, TROPONINI in the last 168 hours. BNP (last 3 results) No results for input(s): BNP in the last 8760 hours.  ProBNP (last 3 results) No results for input(s): PROBNP in the last 8760 hours.  CBG: No results for input(s): GLUCAP in the last 168 hours.  No results found for this or any previous visit (from the past 240 hour(s)).   Studies: No results found.  Scheduled Meds: . folic acid  1 mg Oral Daily  . furosemide  40 mg Oral Daily  . lactulose  30 g Oral BID  . lidocaine  1 patch Transdermal Q24H  . multivitamin with  minerals  1 tablet Oral Daily  . pantoprazole  40 mg Oral BID  . QUEtiapine  50 mg Oral BID  . rifaximin  550 mg Oral BID  . sodium chloride  3 mL Intravenous Q12H  . spironolactone  100 mg Oral Daily   Continuous Infusions: . sodium chloride 10 mL/hr at 06/02/14 1244    Time Spent: 20  Mikal Blasdell, MD, FACP, FHM. [facility] Pager [phone]  If 7PM-7AM, please contact night-coverage [email or web address] Password [email or web address] 06/02/2014, 4:43 PM    LOS: 11 days

## 2014-06-02 NOTE — Progress Notes (Signed)
    Progress Note   Subjective  Patient remains confused Patient was restrained earlier wrist restraints removed several hours ago Unaware of where he is or time and date Denies pain when he arouses but returns to sleep   Objective   Vital signs in last 24 hours: Temp:  [97.5 F (36.4 C)-99.7 F (37.6 C)] 99.7 F (37.6 C) (04/24 0529) Pulse Rate:  [96-106] 100 (04/24 0529) Resp:  [20-26] 20 (04/24 0529) BP: (146-154)/(70-77) 154/77 mmHg (04/24 0529) SpO2:  [84 %-100 %] 95 % (04/24 0529) Last BM Date: 05/30/14 Gen: Sleepy in  NAD HEENT: icteric, op clear CV: tachy, but reg Pulm: Decreased bilateral bases with crackles, poor inspiratory effort Abd: soft, NT, mod distention, +BS throughout Ext: 2-3+ LE pitting edema Neuro: Arouses to voice but returns to sleep, difficult to assess for asterixis but with definite tremor, moving all extremities  Intake/Output from previous day: 04/23 0701 - 04/24 0700 In: 1410 [P.O.:660; I.V.:750] Out: 1775 [Urine:1775] Intake/Output this shift: Total I/O In: 960 [P.O.:960] Out: 300 [Urine:300]  Lab Results:  Recent Labs  05/31/14 0620  WBC 4.3  HGB 11.5*  HCT 37.5*  PLT 91*   BMET  Recent Labs  05/31/14 0620 06/01/14 0530 06/02/14 0525  NA 140 139 139  K 3.7 4.0 3.9  CL 110 109 107  CO2 26 27 29   GLUCOSE 123* 118* 118*  BUN 10 9 9   CREATININE 0.52 0.61 0.73  CALCIUM 11.0* 10.7* 10.7*   LFT  Recent Labs  06/02/14 0525  PROT 5.8*  ALBUMIN 2.1*  AST 57*  ALT 49  ALKPHOS 117  BILITOT 1.6*  BILIDIR 0.8*  IBILI 0.8   Studies/Results: MRI brain -- no acute abnormality    Assessment / Plan:   64 year old male with alcohol and hepatitis C cirrhosis now decompensated, active alcohol abuse, chronic pain on narcotics, liver lesions and elevated AFP suspicious for HCC, with ongoing confusion, agitation  1. Decompensated liver disease/ETOH and Hep C -- confusion and agitation continued to be biggest issue. Felt to be  multifactorial though today I am more suspicious for persistent hepatic encephalopathy. He has been receiving rifaximin. Lactulose was backed off of when he was having frequent loose stools. Only 1 or 2 bowel movements yesterday --Increase lactulose with 1 dose now, repeat in 3 hours. Target 3-5 stools daily --Continue rifaximin 550 mg twice a day --Internal medicine to titrate Seroquel which may be contributing to some somnolence seen today --Limit narcotics and benzodiazepines as much as possible --Agree with neurology consult  2. Liver lesions elevated AFP -- eventual liver MRI when patient can cooperate  3. Volume overload -- considerable lower extremity edema. Will initiate diuretic therapy. No ascites seen on ultrasound from 10 days ago though probable ascites now. Renal function has been stable. Monitor renal function closely with diuretics    Principal Problem:   Acute encephalopathy Active Problems:   Chronic back pain   Alcohol abuse   Hyponatremia   Thrombocytopenia   Alcohol withdrawal   Hypercalcemia   Wernicke encephalopathy   Cirrhosis   Liver masses   Hepatitis C antibody test positive     LOS: 11 days   Aleila Syverson M  06/02/2014, 1:15 PM

## 2014-06-03 ENCOUNTER — Inpatient Hospital Stay (HOSPITAL_COMMUNITY): Payer: BLUE CROSS/BLUE SHIELD

## 2014-06-03 ENCOUNTER — Inpatient Hospital Stay (HOSPITAL_COMMUNITY)
Admit: 2014-06-03 | Discharge: 2014-06-03 | Disposition: A | Discharge status: Home / Self Care | Payer: BLUE CROSS/BLUE SHIELD | Attending: Neurology | Admitting: Neurology

## 2014-06-03 ENCOUNTER — Encounter (HOSPITAL_COMMUNITY): Payer: Self-pay | Admitting: Radiology

## 2014-06-03 LAB — COMPREHENSIVE METABOLIC PANEL
ALK PHOS: 153 U/L — AB (ref 39–117)
ALT: 59 U/L — AB (ref 0–53)
AST: 70 U/L — AB (ref 0–37)
Albumin: 2.2 g/dL — ABNORMAL LOW (ref 3.5–5.2)
Anion gap: 2 — ABNORMAL LOW (ref 5–15)
BUN: 10 mg/dL (ref 6–23)
CALCIUM: 10.8 mg/dL — AB (ref 8.4–10.5)
CHLORIDE: 106 mmol/L (ref 96–112)
CO2: 27 mmol/L (ref 19–32)
Creatinine, Ser: 0.78 mg/dL (ref 0.50–1.35)
GFR calc Af Amer: >90 mL/min (ref >90)
Glucose, Bld: 143 mg/dL — ABNORMAL HIGH (ref 70–99)
POTASSIUM: 3.7 mmol/L (ref 3.5–5.1)
SODIUM: 135 mmol/L (ref 135–145)
TOTAL PROTEIN: 6.1 g/dL (ref 6.0–8.3)
Total Bilirubin: 1.8 mg/dL — ABNORMAL HIGH (ref 0.3–1.2)

## 2014-06-03 LAB — CBC
HCT: 41.2 % (ref 39.0–52.0)
Hemoglobin: 12.7 g/dL — ABNORMAL LOW (ref 13.0–17.0)
MCH: 30.9 pg (ref 26.0–34.0)
MCHC: 30.8 g/dL (ref 30.0–36.0)
MCV: 100.2 fL — AB (ref 78.0–100.0)
Platelets: 82 10*3/uL — ABNORMAL LOW (ref 150–400)
RBC: 4.11 MIL/uL — ABNORMAL LOW (ref 4.22–5.81)
RDW: 14.9 % (ref 11.5–15.5)
WBC: 6.5 10*3/uL (ref 4.0–10.5)

## 2014-06-03 LAB — PROTIME-INR
INR: 2.36 — ABNORMAL HIGH (ref 0.00–1.49)
PROTHROMBIN TIME: 26 s — AB (ref 11.6–15.2)

## 2014-06-03 LAB — HEPATITIS C GENOTYPE: HCV Genotype: 3

## 2014-06-03 MED ORDER — QUETIAPINE FUMARATE 25 MG PO TABS
25.0000 mg | ORAL_TABLET | Freq: Once | ORAL | Status: AC
  Administered 2014-06-03: 25 mg via ORAL
  Filled 2014-06-03: qty 1

## 2014-06-03 MED ORDER — VITAMIN B-1 100 MG PO TABS
100.0000 mg | ORAL_TABLET | Freq: Every day | ORAL | Status: DC
  Administered 2014-06-03 – 2014-06-07 (×5): 100 mg via ORAL
  Filled 2014-06-03 (×5): qty 1

## 2014-06-03 MED ORDER — QUETIAPINE FUMARATE 25 MG PO TABS
25.0000 mg | ORAL_TABLET | Freq: Two times a day (BID) | ORAL | Status: DC
  Administered 2014-06-03 – 2014-06-04 (×2): 25 mg via ORAL
  Filled 2014-06-03 (×4): qty 1

## 2014-06-03 MED ORDER — SODIUM CHLORIDE 0.9 % IV SOLN
60.0000 mg | Freq: Once | INTRAVENOUS | Status: AC
  Administered 2014-06-03: 60 mg via INTRAVENOUS
  Filled 2014-06-03: qty 20

## 2014-06-03 NOTE — Progress Notes (Signed)
Pt was seen and examined.  Labs were reviewed.  Full note to follow. Pt is arousable, cooperative but remains confused.  Asterixis is present. Suspect mental status changes are multifactorial including hepatic encepaphalopathy and medication effect.  Try to minimize sedation meds.  Continue lactulose and xafaxan.

## 2014-06-03 NOTE — Progress Notes (Signed)
Patient found in floor in room by the door with feces trailing from bed to patient location. Patient assessed, assisted to bed by staff members, patient sustained no injury. Patient alert and oriented to self, disoriented to place and time. Patient became agitated yelling and swinging at staff. Staff was able to calm, redirect and put back into bed. MD notified, wife notified. Patient currently in bed resting, vital signs stable.

## 2014-06-03 NOTE — Progress Notes (Signed)
Physical Therapy Treatment Patient Details Name: Thomas Navarro MRN: [id number] DOB: 03-Sep-1950 Today's Date: 06/03/2014    History of Present Illness Thomas Navarro is a 64 y.o. male with a past medical history of chronic back pain on narcotic analgesics for the past year, alcohol abuse, presenting to the emergency department 05/22/14 after attempting to check himself into [facility] and was referred to [facility] instead with complaints of mental status changes per wife for several days.    PT Comments    Spouse present during session and very helpful.  Assisted OOB to amb in hallway.  First we used a RW however pt was unable to correctly use/advance.  Amb + 2 HHA with much effort. Pt remains groggy/impaired.   Follow Up Recommendations  SNF (spouse hoping to take pt home if his mental capacity improved)     Equipment Recommendations       Recommendations for Other Services       Precautions / Restrictions Precautions Precautions: Fall Restrictions Weight Bearing Restrictions: No    Mobility  Bed Mobility Overal bed mobility: Needs Assistance Bed Mobility: Supine to Sit     Supine to sit: Min assist;Mod assist     General bed mobility comments: increased time and repeat simple VC's to maintain focus.   Transfers Overall transfer level: Needs assistance Equipment used: Rolling walker (2 wheeled);None Transfers: Sit to/from Stand Sit to Stand: +2 safety/equipment;Mod assist Stand pivot transfers: +2 safety/equipment;+2 physical assistance;Mod assist;Max assist       General transfer comment: + 2 for safety.  Mobility imhibited by poor cogniiton.   Ambulation/Gait Ambulation/Gait assistance: Mod assist;+2 physical assistance;+2 safety/equipment Ambulation Distance (Feet): 75 Feet Assistive device: Rolling walker (2 wheeled);None Gait Pattern/deviations: Step-to pattern;Step-through pattern;Trunk flexed;Narrow base of support;Staggering left;Staggering  right;Shuffle Gait velocity: decreased   General Gait Details: Not much difference from last session.  Pt still requires + 2 assist for mobility.  Impaired cognition inhibits progress.    Stairs            Wheelchair Mobility    Modified Rankin (Stroke Patients Only)       Balance                                    Cognition Arousal/Alertness: Awake/alert Behavior During Therapy: Restless;Impulsive Overall Cognitive Status: Impaired/Different from baseline Area of Impairment: Orientation;Attention;Memory;Following commands;Safety/judgement;Awareness;Problem solving Orientation Level: Person Current Attention Level: Alternating   Following Commands: Follows one step commands inconsistently Safety/Judgement: Decreased awareness of safety;Decreased awareness of deficits Awareness: Emergent Problem Solving: Slow processing      Exercises      General Comments        Pertinent Vitals/Pain Pain Assessment: No/denies pain    Home Living                      Prior Function            PT Goals (current goals can now be found in the care plan section) Progress towards PT goals: Progressing toward goals    Frequency  Min 3X/week    PT Plan      Co-evaluation             End of Session Equipment Utilized During Treatment: Gait belt Activity Tolerance: Treatment limited secondary to medical complications (Comment) (impaired cognition) Patient left: in chair;with call bell/phone within reach;with family/visitor present;with nursing/sitter in room  Time: 6213-0865 PT Time Calculation (min) (ACUTE ONLY): 28 min  Charges:  $Gait Training: 8-22 mins $Therapeutic Activity: 8-22 mins                    G Codes:     Thomas Navarro  PTA [facility]  Acute  Rehab Pager      [phone]

## 2014-06-03 NOTE — Progress Notes (Signed)
Pt voided this am at about 11 am. Not measured d/t condom cath fell off in bed. Void did saturate bed linens.

## 2014-06-03 NOTE — Progress Notes (Signed)
[facility] PROGRESS NOTE Interim History: 64 year old male with a history of chronic back pain on chronic opioids, alcohol dependence, alcoholic liver disease presented with 4-5 day history of worsening confusion. Apparently, the patient drinks approximately 1/5 of liquor every week as well as beer on a daily basis. However, he does not consider beer alcohol infrequently minimizes his use. The patient was at [facility] on 05/22/2014. Because of his medical instability, the patient was sent to the emergency department. The patient's wife states that the patient may have been using more oxycodone than prescribed. Since admission, the patient was placed on alcohol withdrawal protocol. The patient's agitation and aggressive behavior has improved although he remains confused. At this point, his acute confusional state is felt to be multifactorial but mostly from hepatic encephalopathy.  Assessment/Plan: Acute encephalopathy d/t hepatic encephalopathy and other causes - Multifactorial due to combination of narcotic overuse, alcohol abuse/withdrawal, hepatic encephalopathy, hypercalcemia and hyponatremia. - B12, RBC folate, TSH and RPR are unremarkable. U/Anegative for pyuria, UDS was negative for drugs. - MRI brain on 4.19.2016: No acute intracranial process  - He was initially treated for alcohol withdrawal using CIWA but his acute confusional status has lasted for close to 2 weeks and hence not felt to be related to alcohol withdrawal any more. Continue thiamine. - Narcotics have been minimized - [facility] following and have increased lactulose, continue rifaximin. Requested nursing to clearly document daily BMs with target of 3-5. - Address hypercalcemia as indicated below. - Hyponatremia has resolved. - Optimize sleep hygiene - We will reduce Seroquel dose which was initiated for agitation. - Neurology input appreciated. Follow EEG  Alcohol dependence/alcohol withdrawal: - Not in  alcohol withdrawal any longer.  Hypercalcemia: - Treated initially with IV fluids and Lasix. - PTH was 24, PTHr peptide is pending. - As per wife patient had a screening colonoscopy about 5 years ago which was negative. - Urinary calcium to creatinine ratio was 0.01. - Vitamin D levels within normal limits. - Started diuretics 4/24 - Corrected serum calcium on 4/25:12.2-concern for malignant hypercalcemia related to possible [place] - We'll provide a dose of pamidronate 4/25 and follow calcium  Liver lesion: - Previous CT scan with contrast suggested hemangioma, alpha-fetoprotein was greater than 3000. - Wife aware we'll try to get an MRI to better define the lesion. - MRI liver when patient able to cooperate for procedure.   Chronic thrombocytopenia: - Likely due to alcohol abuse and liver disease- has been stable.  Hyponatremia: Likely a combination with volume depletion and his cirrhosis. resolved  Chronic back pain: - have d/c tramadol  Cirrhosis: Likely due to alcohol and hepatitis C (HCV: 511021)   Code Status: Full Family Communication: discussed extensively with wife at bedside on 4/24   Disposition Plan: Not medically stable for discharge   Consultants:  GI  Neurology  Procedures:  MRI brain  MRI liver pending  Antibiotics:  Rifaximin  HPI/Subjective: As per spouse at bedside, patient alert her more alert than yesterday. Was able to stay awake to eat breakfast this morning. Leg swelling slightly improved. As per discussion with nursing, stool and urine output not adequately charted. No reported agitation.  Objective: Filed Vitals:   06/02/14 0529 06/02/14 1431 06/02/14 2140 06/03/14 0552  BP: 154/77 153/71 155/80 162/77  Pulse: 100 108 110 118  Temp: 99.7 F (37.6 C) 98 F (36.7 C) 99.6 F (37.6 C) 99.7 F (37.6 C)  TempSrc: Axillary Oral Oral Oral  Resp: 20 22 19  20  Height:      Weight:   101.923 kg (224 lb 11.2 oz)   SpO2: 95% 96% 92% 93%     Intake/Output Summary (Last 24 hours) at 06/03/14 1411 Last data filed at 06/03/14 0517  Gross per 24 hour  Intake    280 ml  Output   1225 ml  Net   -945 ml   Filed Weights   05/22/14 1712 05/31/14 0900 06/02/14 2140  Weight: 97.523 kg (215 lb) 97.841 kg (215 lb 11.2 oz) 101.923 kg (224 lb 11.2 oz)    Exam:  General: Middle aged male, chronically ill looking, lying comfortably propped up in bed.  Heart: S1 and S2 heard, RRR. No JVD, murmurs. 3+ pitting bilateral leg edema Lungs: Clear to auscultation. No increased work of breathing. Abdomen: Mildly distended but soft and nontender. Normal bowel sounds heard. No organomegaly or masses appreciated. No appreciable ascites.   Neuro: somnolent but more easily arousable and able to stay awake alert for longer today. Oriented only to self. [place] +.  Data Reviewed: Basic Metabolic Panel:  Recent Labs Lab 05/29/14 0550 05/31/14 0620 06/01/14 0530 06/02/14 0525 06/03/14 0546  NA 136 140 139 139 135  K 3.8 3.7 4.0 3.9 3.7  CL 108 110 109 107 106  CO2 24 26 27 29 27   GLUCOSE 104* 123* 118* 118* 143*  BUN 10 10 9 9 10   CREATININE 0.66 0.52 0.61 0.73 0.78  CALCIUM 10.1 11.0* 10.7* 10.7* 10.8*   Liver Function Tests:  Recent Labs Lab 05/28/14 0538 05/29/14 0550 05/31/14 0620 06/02/14 0525 06/03/14 0546  AST 67* 61* 52* 57* 70*  ALT 63* 61* 55* 49 59*  ALKPHOS 129* 126* 124* 117 153*  BILITOT 2.4* 2.5* 2.1* 1.6* 1.8*  PROT 5.9* 5.8* 5.8* 5.8* 6.1  ALBUMIN 2.3* 2.1* 2.2* 2.1* 2.2*   No results for input(s): LIPASE, AMYLASE in the last 168 hours.  Recent Labs Lab 05/29/14 0550 05/31/14 0620 06/01/14 0530  AMMONIA 24 30 41*   CBC:  Recent Labs Lab 05/31/14 0620 06/03/14 0546  WBC 4.3 6.5  HGB 11.5* 12.7*  HCT 37.5* 41.2  MCV 99.5 100.2*  PLT 91* 82*   Cardiac Enzymes: No results for input(s): CKTOTAL, CKMB, CKMBINDEX, TROPONINI in the last 168 hours. BNP (last 3 results) No results for input(s):  BNP in the last 8760 hours.  ProBNP (last 3 results) No results for input(s): PROBNP in the last 8760 hours.  CBG: No results for input(s): GLUCAP in the last 168 hours.  No results found for this or any previous visit (from the past 240 hour(s)).   Studies: No results found.  Scheduled Meds: . folic acid  1 mg Oral Daily  . furosemide  40 mg Oral Daily  . lactulose  30 g Oral BID  . lidocaine  1 patch Transdermal Q24H  . multivitamin with minerals  1 tablet Oral Daily  . pantoprazole  40 mg Oral BID  . QUEtiapine  25 mg Oral Q12H  . rifaximin  550 mg Oral BID  . sodium chloride  3 mL Intravenous Q12H  . spironolactone  100 mg Oral Daily  . thiamine  100 mg Oral Daily   Continuous Infusions:    Time Spent: 55  Devinn Hurwitz, MD, FACP, FHM. [facility] Pager [phone]  If 7PM-7AM, please contact night-coverage [email or web address] Password [email or web address] 06/03/2014, 2:11 PM    LOS: 12 days

## 2014-06-03 NOTE — Procedures (Signed)
EEG report.  Brief clinical history: 64 year old male admitted to [facility] for further evaluation of confusion with history of substance abuse, cirrhosis and hepatitis C. Confusion has progressed since admission. Head CT and MRI of the brain shows no acute changes. Ammonia levels have been slowly increasing. LFT's are abnormal  Technique: this is a 17 channel routine scalp EEG performed at the bedside with bipolar and monopolar montages arranged in accordance to the international 10/20 system of electrode placement. One channel was dedicated to EKG recording.  The study was performed predominantly during wakefulness, with a very brief period of wakefulness.. No activating procedures performed.  Description: patient spent very limited amount of time in the wakeful state, a period in which the best background consistent of a medium amplitude and poorly sustained 6 to 7 Hz rhythm that is symmetric and reactive.  the best background consisted of a medium amplitude, posterior dominant, well sustained, symmetric and reactive Drowsiness demonstrated dropout of the alpha rhythm. Stage 2 sleep showed symmetric and synchronous sleep spindles without intermixed epileptiform discharges. No focal or generalized epileptiform discharges noted.  EKG showed sinus rhythm.  Impression: this is an abnormal awake and asleep EEG with findings suggestive of a mild non specific encephalopathy. No electrographic seizures noted. Clinical correlation is advised.   Dorian Pod, MD [facility]

## 2014-06-03 NOTE — Progress Notes (Signed)
Addendum  Patient apparently found on floor in the room earlier this afternoon-? Sustained a fall. As per nursing, no overt injuries at that time. Subsequently spouse noticed "knot" on left forehead approximately 3 cm diameter. Mental status unchanged/not worsened. Requested CT head without contrast: No acute findings. Safety sitter apparently not available earlier today. Requested nursing to discuss with charge nurse or floor nursing director or request family to assist.  Vernell Leep, MD, FACP, [place]. [facility] Pager [phone]  If 7PM-7AM, please contact night-coverage [email or web address] Password [facility] 06/03/2014, 7:26 PM

## 2014-06-04 ENCOUNTER — Inpatient Hospital Stay (HOSPITAL_COMMUNITY): Payer: BLUE CROSS/BLUE SHIELD

## 2014-06-04 DIAGNOSIS — J9601 Acute respiratory failure with hypoxia: Secondary | ICD-10-CM | POA: Insufficient documentation

## 2014-06-04 LAB — BASIC METABOLIC PANEL
ANION GAP: 4 — AB (ref 5–15)
BUN: 9 mg/dL (ref 6–23)
CALCIUM: 11.5 mg/dL — AB (ref 8.4–10.5)
CHLORIDE: 104 mmol/L (ref 96–112)
CO2: 28 mmol/L (ref 19–32)
CREATININE: 0.74 mg/dL (ref 0.50–1.35)
GFR calc Af Amer: >90 mL/min (ref >90)
GFR calc non Af Amer: >90 mL/min (ref >90)
Glucose, Bld: 115 mg/dL — ABNORMAL HIGH (ref 70–99)
POTASSIUM: 3.7 mmol/L (ref 3.5–5.1)
Sodium: 136 mmol/L (ref 135–145)

## 2014-06-04 LAB — URINALYSIS, ROUTINE W REFLEX MICROSCOPIC
GLUCOSE, UA: NEGATIVE mg/dL
Hgb urine dipstick: NEGATIVE
KETONES UR: NEGATIVE mg/dL
LEUKOCYTES UA: NEGATIVE
Nitrite: NEGATIVE
Protein, ur: NEGATIVE mg/dL
SPECIFIC GRAVITY, URINE: 1.025 (ref 1.005–1.030)
Urobilinogen, UA: 0.2 mg/dL (ref 0.0–1.0)
pH: 5.5 (ref 5.0–8.0)

## 2014-06-04 LAB — AMMONIA: AMMONIA: 48 umol/L — AB (ref 11–32)

## 2014-06-04 MED ORDER — LACTULOSE 10 GM/15ML PO SOLN
30.0000 g | Freq: Three times a day (TID) | ORAL | Status: DC
  Administered 2014-06-04 – 2014-06-06 (×8): 30 g via ORAL
  Filled 2014-06-04 (×12): qty 45

## 2014-06-04 MED ORDER — QUETIAPINE 12.5 MG HALF TABLET
12.5000 mg | ORAL_TABLET | Freq: Two times a day (BID) | ORAL | Status: DC
  Administered 2014-06-04 – 2014-06-06 (×4): 12.5 mg via ORAL
  Filled 2014-06-04 (×6): qty 1

## 2014-06-04 MED ORDER — HALOPERIDOL LACTATE 5 MG/ML IJ SOLN: 0.5000 mg | Freq: Three times a day (TID) | INTRAMUSCULAR | Status: DC | PRN

## 2014-06-04 MED ORDER — FUROSEMIDE 10 MG/ML IJ SOLN
40.0000 mg | Freq: Once | INTRAMUSCULAR | Status: AC
  Administered 2014-06-04: 40 mg via INTRAVENOUS
  Filled 2014-06-04: qty 4

## 2014-06-04 MED ORDER — OXYCODONE HCL 5 MG PO TABS
2.5000 mg | ORAL_TABLET | Freq: Three times a day (TID) | ORAL | Status: DC | PRN
  Administered 2014-06-07: 2.5 mg via ORAL
  Filled 2014-06-04: qty 1

## 2014-06-04 NOTE — Progress Notes (Signed)
[facility] PROGRESS NOTE Interim History: 64 year old male with a history of chronic back pain on chronic opioids, alcohol dependence, alcoholic liver disease presented with 4-5 day history of worsening confusion. Apparently, the patient drinks approximately 1/5 of liquor every week as well as beer on a daily basis. However, he does not consider beer alcohol infrequently minimizes his use. The patient was at [facility] on 05/22/2014. Because of his medical instability, the patient was sent to the emergency department. The patient's wife states that the patient may have been using more oxycodone than prescribed. Since admission, the patient was placed on alcohol withdrawal protocol. The patient's agitation and aggressive behavior has improved although he remains confused. At this point, his acute confusional state is felt to be multifactorial. Patient has not made substantial improvement and has actually declined since hospitalization. Neurology signed off. [facility] consulting. Consult palliative care team for goals of care.  Assessment/Plan: Acute encephalopathy d/t hepatic encephalopathy and other causes - Multifactorial due to combination of narcotic overuse, alcohol abuse/withdrawal, hepatic encephalopathy, hypercalcemia and hyponatremia. - B12, RBC folate, TSH and RPR are unremarkable. U/Anegative for pyuria, UDS was negative for drugs. - MRI brain on 4.19.2016: No acute intracranial process  - He was initially treated for alcohol withdrawal using CIWA but his acute confusional status has lasted for close to 2 weeks and hence not felt to be related to alcohol withdrawal any more. Continue thiamine. - Narcotics have been minimized - [facility] following and have increased lactulose, continue rifaximin. Requested nursing to clearly document daily BMs with target of 3-5. - Address hypercalcemia as indicated below. - Hyponatremia has resolved. - Optimize sleep hygiene - Seroquel  dose had been reduced yesterday to 25 MG twice a day and will again reduce further to 12.5 MG twice a day. However patient has periodic agitation and did get a dose of IV Haldol overnight.  - Neurology input appreciated. Follow EEG- no seizures - Urine microscopy: No features of UTI. No other clinical suspicion for infections.  Alcohol dependence/alcohol withdrawal: - Not in alcohol withdrawal any longer.  Hypercalcemia: - Treated initially with IV fluids and Lasix. - PTH was 24, PTHr peptide is pending. - As per wife patient had a screening colonoscopy about 5 years ago which was negative. - Urinary calcium to creatinine ratio was 0.01. - Vitamin D levels within normal limits. - Started diuretics 4/24 - Corrected serum calcium on 4/25:12.2-concern for malignant hypercalcemia related to possible HCC - s/p a dose of pamidronate 4/25 and follow calcium - Corrected Ca on 4/26:12.9. Pamidronate may take a couple of days to act. Unable to give IV fluids secondary to volume overloaded state. We will give IV Lasix which should help the hypercalcemia and volume overload/pulmonary edema.   Liver lesion: - Previous CT scan with contrast suggested hemangioma, alpha-fetoprotein was greater than 3000. - Wife aware we'll try to get an MRI to better define the lesion. - MRI liver when patient able to cooperate for procedure.   Chronic thrombocytopenia: - Likely due to alcohol abuse and liver disease- has been stable.  Hyponatremia: Likely a combination with volume depletion and his cirrhosis. resolved  Chronic back pain: - have d/c tramadol  Cirrhosis/coagulopathy : Likely due to alcohol and hepatitis C (HCV: 295621)  Acute hypoxic respiratory failure  - Secondary to volume overload from advanced liver disease. - We will give a dose of IV Lasix and can be repeated daily as needed. - Check 2-D echo.  Fall on 4/25 -  No overt injuries - CT head without acute findings - Safety  sitter   Code Status: Full Family Communication: discussed extensively with wife at bedside on 4/26   Disposition Plan: Not medically stable for discharge   Consultants:  GI  Neurology  Palliative care team   Procedures:  MRI brain  MRI liver pending  Antibiotics:  Rifaximin  HPI/Subjective: As per nursing, patient was agitated last night-swinging at nurses, pulling condom cath. Received a dose of Haldol 1 mg. This morning somnolent, arousable but not oriented. 2 BMs over last 24 hours. Later this afternoon, noted to have hypoxia with oxygen saturation of 87% without dyspnea.   Objective: Filed Vitals:   06/03/14 2128 06/04/14 0508 06/04/14 1405 06/04/14 1410  BP: 149/72 150/88 120/57   Pulse: 116 102 102   Temp: 98.3 F (36.8 C) 98.8 F (37.1 C) 98.7 F (37.1 C)   TempSrc: Oral Axillary Axillary   Resp: 20 20 22    Height:      Weight:      SpO2: 90% 90% 87% 94%    Intake/Output Summary (Last 24 hours) at 06/04/14 1828 Last data filed at 06/04/14 1823  Gross per 24 hour  Intake    600 ml  Output   1000 ml  Net   -400 ml   Filed Weights   05/22/14 1712 05/31/14 0900 06/02/14 2140  Weight: 97.523 kg (215 lb) 97.841 kg (215 lb 11.2 oz) 101.923 kg (224 lb 11.2 oz)    Exam:  General: Middle aged male, chronically ill looking, lying comfortably propped up in bed.  Heart: S1 and S2 heard, RRR. No JVD, murmurs. 3+ pitting bilateral leg edema Lungs: Clear to auscultation. No increased work of breathing. Abdomen: Mildly distended but soft and nontender. Normal bowel sounds heard. No organomegaly or masses appreciated. No appreciable ascites.   Neuro: somnolent, arousable but incoherent.  Data Reviewed: Basic Metabolic Panel:  Recent Labs Lab 05/31/14 0620 06/01/14 0530 06/02/14 0525 06/03/14 0546 06/04/14 0520  NA 140 139 139 135 136  K 3.7 4.0 3.9 3.7 3.7  CL 110 109 107 106 104  CO2 26 27 29 27 28   GLUCOSE 123* 118* 118* 143* 115*  BUN 10 9 9 10  9   CREATININE 0.52 0.61 0.73 0.78 0.74  CALCIUM 11.0* 10.7* 10.7* 10.8* 11.5*   Liver Function Tests:  Recent Labs Lab 05/29/14 0550 05/31/14 0620 06/02/14 0525 06/03/14 0546  AST 61* 52* 57* 70*  ALT 61* 55* 49 59*  ALKPHOS 126* 124* 117 153*  BILITOT 2.5* 2.1* 1.6* 1.8*  PROT 5.8* 5.8* 5.8* 6.1  ALBUMIN 2.1* 2.2* 2.1* 2.2*   No results for input(s): LIPASE, AMYLASE in the last 168 hours.  Recent Labs Lab 05/29/14 0550 05/31/14 0620 06/01/14 0530 06/04/14 0938  AMMONIA 24 30 41* 48*   CBC:  Recent Labs Lab 05/31/14 0620 06/03/14 0546  WBC 4.3 6.5  HGB 11.5* 12.7*  HCT 37.5* 41.2  MCV 99.5 100.2*  PLT 91* 82*   Cardiac Enzymes: No results for input(s): CKTOTAL, CKMB, CKMBINDEX, TROPONINI in the last 168 hours. BNP (last 3 results) No results for input(s): BNP in the last 8760 hours.  ProBNP (last 3 results) No results for input(s): PROBNP in the last 8760 hours.  CBG: No results for input(s): GLUCAP in the last 168 hours.  No results found for this or any previous visit (from the past 240 hour(s)).   Studies: Ct Head Wo Contrast  06/03/2014   CLINICAL DATA:  Found on floor.  Agitated.  Alert and oriented.  EXAM: CT HEAD WITHOUT CONTRAST  TECHNIQUE: Contiguous axial images were obtained from the base of the skull through the vertex without intravenous contrast.  COMPARISON:  05/22/2014  FINDINGS: Ventricles, cisterns and other CSF spaces are within normal. There is no mass, mass effect, shift of midline structures or acute hemorrhage. No evidence of acute infarction. Subtle chronic ischemic microvascular disease. Remaining bones and soft tissues are within normal  IMPRESSION: No acute intracranial findings.  Minimal chronic ischemic microvascular disease.   Electronically Signed   By: Marin Olp M.D.   On: 06/03/2014 19:14   Dg Chest Port 1 View  06/04/2014   CLINICAL DATA:  Hypoxia.  EXAM: PORTABLE CHEST - 1 VIEW  COMPARISON:  None.  FINDINGS:  Cardiomegaly is again noted. A diffuse interstitial pattern is compatible with edema. No significant focal airspace consolidation is present. The visualized soft tissues and bony thorax are unremarkable.  IMPRESSION: 1. Cardiomegaly and increased interstitial edema compatible with congestive heart failure.   Electronically Signed   By: San Morelle M.D.   On: 06/04/2014 15:55    Scheduled Meds: . folic acid  1 mg Oral Daily  . furosemide  40 mg Oral Daily  . lactulose  30 g Oral TID  . lidocaine  1 patch Transdermal Q24H  . multivitamin with minerals  1 tablet Oral Daily  . pantoprazole  40 mg Oral BID  . QUEtiapine  12.5 mg Oral Q12H  . rifaximin  550 mg Oral BID  . sodium chloride  3 mL Intravenous Q12H  . spironolactone  100 mg Oral Daily  . thiamine  100 mg Oral Daily   Continuous Infusions:    Time Spent: 32  Laylamarie Meuser, MD, FACP, FHM. [facility] Pager [phone]  If 7PM-7AM, please contact night-coverage [email or web address] Password [facility] 06/04/2014, 6:28 PM    LOS: 13 days

## 2014-06-04 NOTE — Progress Notes (Signed)
Physical Therapy Treatment Patient Details Name: Thomas Navarro MRN: [id number] DOB: 10/15/1950 Today's Date: 06/04/2014    History of Present Illness Thomas Navarro is a 64 y.o. male with a past medical history of chronic back pain on narcotic analgesics for the past year, alcohol abuse, presenting to the emergency department 05/22/14 after attempting to check himself into [facility] and was referred to [facility] instead with complaints of mental status changes per wife for several days.    PT Comments    Spouse present during session, stated "he is worse today".  Noted per chart review pt fell OOB yesterday afternoon.  Difficulty maintaining arousal, assisted pt OOB to [facility] + 2 MAX assist.  Pt very groggy but also figity.  Unable to attempt amb. Pt was unable to support self safely with transfer.    Follow Up Recommendations  SNF     Equipment Recommendations       Recommendations for Other Services       Precautions / Restrictions Precautions Precautions: Fall Restrictions Weight Bearing Restrictions: No    Mobility  Bed Mobility Overal bed mobility: Needs Assistance Bed Mobility: Supine to Sit     Supine to sit: Max assist     General bed mobility comments: assist with upper body and repeat cueing/direction to stay alert and on task.  Transfers Overall transfer level: Needs assistance Equipment used: None Transfers: Sit to/from [facility] Sit to Stand: +2 physical assistance;Max assist;+2 safety/equipment Stand pivot transfers: +2 physical assistance;+2 safety/equipment;Max assist       General transfer comment: + 2 for safety.  Mobility imhibited by poor cogniiton. 100% cueing for direction and hand over hand cueing needed as well.  Assisted from bed to [facility] with much effort.   Ambulation/Gait             General Gait Details: amb deferred secondary pt's inability to maintain arouseal.    Stairs            Wheelchair Mobility     Modified Rankin (Stroke Patients Only)       Balance                                    Cognition                            Exercises      General Comments        Pertinent Vitals/Pain Pain Assessment: No/denies pain    Home Living                      Prior Function            PT Goals (current goals can now be found in the care plan section) Progress towards PT goals: Progressing toward goals    Frequency  Min 3X/week    PT Plan      Co-evaluation             End of Session Equipment Utilized During Treatment: Gait belt Activity Tolerance: Treatment limited secondary to medical complications (Comment) Patient left: Other (comment) (on BSC with NT sitter)     Time: 1125-1140 PT Time Calculation (min) (ACUTE ONLY): 15 min  Charges:  $Therapeutic Activity: 8-22 mins  G Codes:      Rica Koyanagi  PTA [facility]  Acute  Rehab Pager      [phone]

## 2014-06-04 NOTE — Progress Notes (Signed)
CSW met with [name] along with CM. CSW and CM discussed discharge planning needs. CSW emphasized the need to begin the SNF placement process even if Pt and wife decide against it ultimately due to insurance authorization process. Pt wife was tearful and described not understanding why Pt has gotten worse sense admission, particularly his mental status. CSW attempted to educate [name] on cognitive effects of long-term alcohol abuse and described that his mental status change could possibly be long-term as permanent cognitive changes can be an effect of long-term alcohol abuse. Pt wife was slightly defensive while discussing this topic stating that she does not think it is right that Pt's mental status would be worse without alcohol than it was while he was drinking.   Pt wife agreeable to initiating SNF referral process in order to have options for Pt discharge.   Thomas Navarro, [place] 06/04/2014 4:01 PM [phone]

## 2014-06-04 NOTE — Progress Notes (Signed)
     [facility] Progress Note  Subjective:   [place]  Last night.  Head CT with no intracranial findings.   Objective:  Vital signs in last 24 hours: Temp:  [97.6 F (36.4 C)-98.8 F (37.1 C)] 98.8 F (37.1 C) (04/26 0508) Pulse Rate:  [102-126] 102 (04/26 0508) Resp:  [20-22] 20 (04/26 0508) BP: (142-150)/(72-88) 150/88 mmHg (04/26 0508) SpO2:  [90 %] 90 % (04/26 0508) Last BM Date: 06/03/14 General:   Sleeping, will open eyes to command, not oriented to person, time, place Heart:  Regular rate and rhythm; no murmurs Pulm;lungs clear Abdomen:  Soft, mildly distended, nontender Normal bowel sounds, without guarding, and without rebound.   Extremities:  Without edema. Neurologic:somnolent     Lab Results:  Recent Labs  06/03/14 0546  WBC 6.5  HGB 12.7*  HCT 41.2  PLT 82*   BMET  Recent Labs  06/02/14 0525 06/03/14 0546 06/04/14 0520  NA 139 135 136  K 3.9 3.7 3.7  CL 107 106 104  CO2 29 27 28   GLUCOSE 118* 143* 115*  BUN 9 10 9   CREATININE 0.73 0.78 0.74  CALCIUM 10.7* 10.8* 11.5*   LFT  Recent Labs  06/02/14 0525 06/03/14 0546  PROT 5.8* 6.1  ALBUMIN 2.1* 2.2*  AST 57* 70*  ALT 49 59*  ALKPHOS 117 153*  BILITOT 1.6* 1.8*  BILIDIR 0.8*  --   IBILI 0.8  --    PT/INR  Recent Labs  06/03/14 0546  LABPROT 26.0*  INR 2.36*     Ct Head Wo Contrast  06/03/2014   CLINICAL DATA:  Found on floor.  Agitated.  Alert and oriented.  EXAM: CT HEAD WITHOUT CONTRAST  TECHNIQUE: Contiguous axial images were obtained from the base of the skull through the vertex without intravenous contrast.  COMPARISON:  05/22/2014  FINDINGS: Ventricles, cisterns and other CSF spaces are within normal. There is no mass, mass effect, shift of midline structures or acute hemorrhage. No evidence of acute infarction. Subtle chronic ischemic microvascular disease. Remaining bones and soft tissues are within normal  IMPRESSION: No acute intracranial findings.   Minimal chronic ischemic microvascular disease.   Electronically Signed   By: Marin Olp M.D.   On: 06/03/2014 19:14    ASSESSMENT/PLAN:  64 yo male with hx ETOH abuse, opiod use, alcoholic liver disease admitted with change of mental status. Neurology has seen pt--EEG with findings suggestive of a mild nonspecific encephalopathy.( ? If pt may benefit form LP) Pt on xifaxan and lactulose. Will recheck ammonia. Narcotics have been minimized, seroquel was decreased, hyponatremia has been corrected.Pt may need SNF placement if mental status does not improve.       LOS: 13 days   Jana Swartzlander, Deloris Ping 06/04/2014, Pager [phone]

## 2014-06-04 NOTE — Progress Notes (Signed)
NEURO HOSPITALIST PROGRESS NOTE   SUBJECTIVE:                                                                                                                        Drowsy but able to follow simple commands and recognizes his wife.  Received single dose IV haldol last night. Seroquel decreased to 25 mg BID. [place] and found on the floor yesterday afternoon, CT brain unremarkable. EEG showed no electrographic seizures but mild diffuse slowing consistent with encephalopathy. Ammonia 48, on Lactulose, Calcium 11.5   OBJECTIVE:                                                                                                                           Vital signs in last 24 hours: Temp:  [97.6 F (36.4 C)-98.8 F (37.1 C)] 98.8 F (37.1 C) (04/26 0508) Pulse Rate:  [102-126] 102 (04/26 0508) Resp:  [20-22] 20 (04/26 0508) BP: (142-150)/(72-88) 150/88 mmHg (04/26 0508) SpO2:  [90 %] 90 % (04/26 0508)  Intake/Output from previous day:   Intake/Output this shift: Total I/O In: 240 [P.O.:240] Out: -  Nutritional status: Diet regular Room service appropriate?: Yes; Fluid consistency:: Thin  Past Medical History  Diagnosis Date  . Chronic back pain   . PONV (postoperative nausea and vomiting)   Physical exam: Limited exam compared to the patient's refusal/inability to cooperate. Mildly agitated. General - 64 year old male. Uncooperative. Heart - Regular rate and rhythm - no murmer appreciated Lungs - poor inspiratory effort Abdomen - not able to evaluate Extremities - 2-3 + pitting edema bilaterally Skin - Warm and dry Appears mildly jaundiced  Neurologic Exam:  Mental Status: Open eyes to verbal commands but inattentive. Has difficulty following other than simple commands. Cranial Nerves: II: Discs not visualized; Visual fields could not be evaluated, pupils equal, round, reactive to light and accommodation III,IV, VI: ptosis not present,  extra-ocular motions intact bilaterally with max cueing V,VII: smile symmetric, facial light touch sensation normal bilaterally VIII: hearing normal bilaterally IX,X: gag reflex could not be tested XI: bilateral shoulder shrug cannot be tested XII: midline tongue extension Motor: Moves all extremities. Grip strength 3 over 5 bilaterally. Tone and bulk:normal tone throughout; no atrophy noted Sensory: Could not  be tested Deep Tendon Reflexes: Could not be tested Plantars: Right: downgoingLeft: downgoing Cerebellar: Patient unable to follow commands for testing Gait: The patient is restrained and confined to bed   Lab Results: No results found for: CHOL Lipid Panel No results for input(s): CHOL, TRIG, HDL, CHOLHDL, VLDL, LDLCALC in the last 72 hours.  Studies/Results: Ct Head Wo Contrast  06/03/2014   CLINICAL DATA:  Found on floor.  Agitated.  Alert and oriented.  EXAM: CT HEAD WITHOUT CONTRAST  TECHNIQUE: Contiguous axial images were obtained from the base of the skull through the vertex without intravenous contrast.  COMPARISON:  05/22/2014  FINDINGS: Ventricles, cisterns and other CSF spaces are within normal. There is no mass, mass effect, shift of midline structures or acute hemorrhage. No evidence of acute infarction. Subtle chronic ischemic microvascular disease. Remaining bones and soft tissues are within normal  IMPRESSION: No acute intracranial findings.  Minimal chronic ischemic microvascular disease.   Electronically Signed   By: Marin Olp M.D.   On: 06/03/2014 19:14    MEDICATIONS                                                                                                                        Scheduled: . folic acid  1 mg Oral Daily  . furosemide  40 mg Oral Daily  . lactulose  30 g Oral TID  . lidocaine  1 patch Transdermal Q24H  . multivitamin with minerals  1 tablet Oral Daily  . pantoprazole  40 mg Oral BID  . QUEtiapine   25 mg Oral Q12H  . rifaximin  550 mg Oral BID  . sodium chloride  3 mL Intravenous Q12H  . spironolactone  100 mg Oral Daily  . thiamine  100 mg Oral Daily    ASSESSMENT/PLAN:                                                                                                            64 year old male admitted to [facility] 4/13 for further evaluation of confusion with history of substance abuse, cirrhosis and hepatitis C. Confusion has progressed since admission. Head CT and MRI of the brain have been personally reviewed and shows no acute changes.EEG showed no electrographic seizures but diffuse slowing. Ammonia levels and serum calcium have been slowly increasing.  Likely metabolic encephalopathy. Continue correcting metabolic derangements. Can try to further decrease Seroquel if feasible. No further neurological intervention at this time. Neuro will sign off.  Dorian Pod, MD [facility] [phone]  06/04/2014, 11:34 AM

## 2014-06-04 NOTE — Progress Notes (Addendum)
Patient's O2 87% on room air. Paged MD. Placed patient on 2L O2. New orders placed.

## 2014-06-04 NOTE — Progress Notes (Signed)
After second dose of lactulose, patient had a medium-sized loose stool. Patient more alert at this time while visiting with his wife.

## 2014-06-05 ENCOUNTER — Inpatient Hospital Stay (HOSPITAL_COMMUNITY): Payer: BLUE CROSS/BLUE SHIELD

## 2014-06-05 DIAGNOSIS — Z515 Encounter for palliative care: Secondary | ICD-10-CM

## 2014-06-05 DIAGNOSIS — R410 Disorientation, unspecified: Secondary | ICD-10-CM

## 2014-06-05 DIAGNOSIS — M549 Dorsalgia, unspecified: Secondary | ICD-10-CM

## 2014-06-05 DIAGNOSIS — R531 Weakness: Secondary | ICD-10-CM

## 2014-06-05 DIAGNOSIS — I509 Heart failure, unspecified: Secondary | ICD-10-CM

## 2014-06-05 LAB — COMPREHENSIVE METABOLIC PANEL
ALT: 62 U/L — ABNORMAL HIGH (ref 0–53)
ANION GAP: 3 — AB (ref 5–15)
AST: 66 U/L — ABNORMAL HIGH (ref 0–37)
Albumin: 2 g/dL — ABNORMAL LOW (ref 3.5–5.2)
Alkaline Phosphatase: 154 U/L — ABNORMAL HIGH (ref 39–117)
BUN: 10 mg/dL (ref 6–23)
CALCIUM: 11.1 mg/dL — AB (ref 8.4–10.5)
CO2: 29 mmol/L (ref 19–32)
CREATININE: 0.66 mg/dL (ref 0.50–1.35)
Chloride: 103 mmol/L (ref 96–112)
GLUCOSE: 112 mg/dL — AB (ref 70–99)
Potassium: 3.7 mmol/L (ref 3.5–5.1)
Sodium: 135 mmol/L (ref 135–145)
TOTAL PROTEIN: 6 g/dL (ref 6.0–8.3)
Total Bilirubin: 3 mg/dL — ABNORMAL HIGH (ref 0.3–1.2)

## 2014-06-05 LAB — CBC
HCT: 38.7 % — ABNORMAL LOW (ref 39.0–52.0)
Hemoglobin: 12.2 g/dL — ABNORMAL LOW (ref 13.0–17.0)
MCH: 30.7 pg (ref 26.0–34.0)
MCHC: 31.5 g/dL (ref 30.0–36.0)
MCV: 97.2 fL (ref 78.0–100.0)
Platelets: 95 10*3/uL — ABNORMAL LOW (ref 150–400)
RBC: 3.98 MIL/uL — AB (ref 4.22–5.81)
RDW: 15.1 % (ref 11.5–15.5)
WBC: 7.5 10*3/uL (ref 4.0–10.5)

## 2014-06-05 LAB — PTH-RELATED PEPTIDE
PTH-related peptide: <0.74 pmol/L
PTH-related peptide: <0.74 pmol/L

## 2014-06-05 MED ORDER — CALCITONIN (SALMON) 200 UNIT/ML IJ SOLN
400.0000 [IU] | Freq: Two times a day (BID) | INTRAMUSCULAR | Status: AC
  Administered 2014-06-05 (×2): 400 [IU] via INTRAMUSCULAR
  Filled 2014-06-05 (×2): qty 2

## 2014-06-05 NOTE — Progress Notes (Signed)
  Echocardiogram 2D Echocardiogram has been performed.  Thomas Navarro 06/05/2014, 3:53 PM

## 2014-06-05 NOTE — Progress Notes (Signed)
Progress Note   Subjective  no specific complaints. Sitter in room   Objective   Vital signs in last 24 hours: Temp:  [97.8 F (36.6 C)-98.7 F (37.1 C)] 98 F (36.7 C) (04/27 0500) Pulse Rate:  [102-109] 108 (04/27 0500) Resp:  [20-22] 20 (04/27 0500) BP: (120-162)/(57-73) 153/67 mmHg (04/27 0500) SpO2:  [87 %-96 %] 95 % (04/27 0500) Last BM Date: 06/04/14 General:    white male in NAD Heart:  Slightly tachycardic Abdomen:  Soft, nontender and nondistended. Normal bowel sounds. Neurologic:  Alert, still sluggish, slow to react, +asterixis.  Psych:  Cooperative.     Lab Results:  Recent Labs  06/03/14 0546 06/05/14 0550  WBC 6.5 7.5  HGB 12.7* 12.2*  HCT 41.2 38.7*  PLT 82* 95*   BMET  Recent Labs  06/03/14 0546 06/04/14 0520 06/05/14 0550  NA 135 136 135  K 3.7 3.7 3.7  CL 106 104 103  CO2 27 28 29   GLUCOSE 143* 115* 112*  BUN 10 9 10   CREATININE 0.78 0.74 0.66  CALCIUM 10.8* 11.5* 11.1*   LFT  Recent Labs  06/05/14 0550  PROT 6.0  ALBUMIN 2.0*  AST 66*  ALT 62*  ALKPHOS 154*  BILITOT 3.0*   PT/INR  Recent Labs  06/03/14 0546  LABPROT 26.0*  INR 2.36*    Studies/Results: Ct Head Wo Contrast  06/03/2014   CLINICAL DATA:  Found on floor.  Agitated.  Alert and oriented.  EXAM: CT HEAD WITHOUT CONTRAST  TECHNIQUE: Contiguous axial images were obtained from the base of the skull through the vertex without intravenous contrast.  COMPARISON:  05/22/2014  FINDINGS: Ventricles, cisterns and other CSF spaces are within normal. There is no mass, mass effect, shift of midline structures or acute hemorrhage. No evidence of acute infarction. Subtle chronic ischemic microvascular disease. Remaining bones and soft tissues are within normal  IMPRESSION: No acute intracranial findings.  Minimal chronic ischemic microvascular disease.   Electronically Signed   By: Marin Olp M.D.   On: 06/03/2014 19:14   Dg Chest Port 1 View  06/05/2014   CLINICAL  DATA:  Hypoxia  EXAM: PORTABLE CHEST - 1 VIEW  COMPARISON:  Portable chest x-ray dated June 04, 2014  FINDINGS: The lungs are hypoinflated. The right hemidiaphragm remains higher than the left. The interstitial markings remain increased. The cardiac silhouette remains enlarged. The pulmonary vascularity is indistinct.  IMPRESSION: CHF with interstitial edema, hypo inflation. Allowing for positioning there has not been significant interval change in the appearance of the chest since yesterday's study.   Electronically Signed   By: David  Martinique M.D.   On: 06/05/2014 07:38   Dg Chest Port 1 View  06/04/2014   CLINICAL DATA:  Hypoxia.  EXAM: PORTABLE CHEST - 1 VIEW  COMPARISON:  None.  FINDINGS: Cardiomegaly is again noted. A diffuse interstitial pattern is compatible with edema. No significant focal airspace consolidation is present. The visualized soft tissues and bony thorax are unremarkable.  IMPRESSION: 1. Cardiomegaly and increased interstitial edema compatible with congestive heart failure.   Electronically Signed   By: [place] M.D.   On: 06/04/2014 15:55      Assessment / Plan:   82. 64 year old male with decompensated cirrhosis / hepatic encephalopathy. He has been very slow to recover. EEG with mild nonspecific encephalopathy. On Xifaxan and lactulose 30grams TID. Ammonia 48 yesterday. He is on Seroquel , last dose 1am yesterday morning. Last dose of narcotics  early yesterday morning. Medications probably having prolonged effect contributing to persistent encephalopathy. He refused meds this am but sitter states wife on way and she usually has success with getting patient to take them. Continue to minimize narcotics and psych medications.   2. CHF, no improvement in CXR today. On 40mg  of lasix daily and 100mg  of aldactone daily. Renal function normal. Net fluid since admission is +6,740.     LOS: 14 days   Thomas Navarro  06/05/2014, 9:41 AM

## 2014-06-05 NOTE — Progress Notes (Signed)
CSW spoke with [name] about SNF bed offers. [name] reports that she is currently considering [facility] in [place]. CSW awaiting call back from [place] at [facility] to discuss Pt case and bed offer.   CSW to continue following while hospitalized and assist with discharge planning needs.  Peri Maris, [place] 06/05/2014 3:56 PM [phone]

## 2014-06-05 NOTE — Consult Note (Signed)
Consultation Note Date: 06/05/2014   Patient Name: Thomas Navarro  DOB: August 22, 1950  MRN: [id number]  Age / Sex: 64 y.o., male   PCP: No Pcp Per Patient Referring Physician: Albertine Patricia, MD  Reason for Consultation: Establishing goals of care  Palliative Care Assessment and Plan Summary of Established Goals of Care and Medical Treatment Preferences    Palliative Care Discussion Held Today Contacts/Participants in Discussion: Wife Yandriel Boening [phone] cell. Primary Decision Maker: Wife HCPOA: yes  Spoke at length with spouse. Issues discussed: 1. Advance Directives: This is something she reports they have never discussed as a couple. She was involved with making her Aunt a DNR but she was elderly, very sick from known causes and that seemed more straight forward than what is going on with her husband. Will continue to work with Mrs. Guedea but sense that unless she has more definitive information as to why her husband appears as he does, she will have a difficult time moving forward with advance directives. She did hear the physician say, that this could be liver cancer. She becomes tearful but the unknown piece of the picture might be keeping her stuck. She also is very worried that unless he can have MRI of liver to obtain more information he might be missing treatment options such as primary treatment for hepatitis C, chemo for [facility], or even a transplant. She also is worried that things will reman as they are in terms of Mr. Spadafore remaining confused and that he will be discharged to a SNF 2. Disease progression related hepatitis C , encephalopathy and the  potential of [place]. Wife thought pt's drinking caused the hepatitis C and discussed that was not the case but certainly alcohol abuse, prescription opioid abuse further taxed an impaired liver. She is also fearful she has the disease but has not gone to get tested. This is new knowledge for Mrs. Istre during this hospitalization   Code  Status/Advance Care Planning:  Full Code   Symptom Management:   Pain: Trying to minimize medication in order to resolve encephalopathy. He is receiving low dose oxycodone for chronic back pain. Would continue unchanged.  Delirium: Pt has been agitated, unable to follow commands. Seroquel tried but pt became somnolent which confused the picture further. Thus far trying to minimize psychotropic drugs, sedating RX. Haldol remains still one of the best studied drugs for delirium and can be given in injectable form for acute delirium and would recommend usage of this 1-2mg  q6 prn for acute agitation  Weakness: PT to continue to ambulate pt  Psycho-social/Spiritual:   Support System: Children  Desire for further Chaplaincy support:not at this point  Prognosis: Unable to determine. Prognosis was not something that spouse was ready to discuss at this point  Discharge Planning:  Undetermined at this point. A definitive dx has not been ascertained and has had a fluctuating clinical picture.       Chief Complaint/History of Present Illness: Chart reviewed. Pt is a 64 yo man day 70 in hospital with unresolving encephalopathy. Pt has a long history of alcohol abuse without any length of sobriety and over the past several years has been overusing oxycodone for back pain. He also has hepatitis C, and CHF. He has been intermittently agitated, unable to participate in care due to delirium. Per chart review medical team was hoping to do an MRI of liver to ascertain underlying severity of liver disease and whether pt now has hepatocellular carcinoma. He was able to  ambulate in the hall today with 2-person assist and has eaten bites and sips with assist  Primary Diagnoses  Present on Admission:  . Acute encephalopathy . Alcohol abuse . Chronic back pain . Hyponatremia . Thrombocytopenia . Alcohol withdrawal  Palliative Review of Systems: Unable to participate. Pt lethargic, confused  I have  reviewed the medical record, interviewed the patient and family, and examined the patient. The following aspects are pertinent.  Past Medical History  Diagnosis Date  . Chronic back pain   . PONV (postoperative nausea and vomiting)    History   Social History  . Marital Status: Married    Spouse Name: N/A  . Number of Children: N/A  . Years of Education: N/A   Social History Main Topics  . Smoking status: Never Smoker   . Smokeless tobacco: Never Used  . Alcohol Use: 3.0 oz/week    4 Cans of beer, 1 Shots of liquor per week     Comment: drinks 1/2 of 1/2 of a pint bottle  . Drug Use: 28.00 per week    Special: Oxycodone  . Sexual Activity: Not Currently   Other Topics Concern  . None   Social History Narrative   History reviewed. No pertinent family history. Scheduled Meds: . calcitonin  400 Units Intramuscular Q12H  . folic acid  1 mg Oral Daily  . furosemide  40 mg Oral Daily  . lactulose  30 g Oral TID  . lidocaine  1 patch Transdermal Q24H  . multivitamin with minerals  1 tablet Oral Daily  . pantoprazole  40 mg Oral BID  . QUEtiapine  12.5 mg Oral Q12H  . rifaximin  550 mg Oral BID  . sodium chloride  3 mL Intravenous Q12H  . spironolactone  100 mg Oral Daily  . thiamine  100 mg Oral Daily   Continuous Infusions:  PRN Meds:.acetaminophen **OR** acetaminophen, haloperidol lactate, ondansetron **OR** ondansetron (ZOFRAN) IV, oxyCODONE Medications Prior to Admission:  Prior to Admission medications   Medication Sig Start Date End Date Taking? Authorizing Provider  diazepam (VALIUM) 5 MG tablet Take 1 tablet by mouth 3 (three) times daily as needed for anxiety.  03/12/13  Yes Historical Provider, MD  loperamide (IMODIUM A-D) 2 MG tablet Take 2 mg by mouth 4 (four) times daily as needed for diarrhea or loose stools.   Yes Historical Provider, MD  Oxycodone HCl 10 MG TABS Take 5-10 mg by mouth 3 (three) times daily. Take up to 3 times a day for back pain   Yes  Historical Provider, MD  cephalexin (KEFLEX) 500 MG capsule Take 1 capsule (500 mg total) by mouth 4 (four) times daily. Patient not taking: Reported on 05/22/2014 11/21/13   Montine Circle, PA-C  oxycodone (ROXICODONE) 5 MG immediate release tablet Take 1-2 tablets (5-10 mg total) by mouth every 6 (six) hours as needed for severe pain. Patient not taking: Reported on 05/22/2014 03/13/13   Virgel Manifold, MD   Allergies  Allergen Reactions  . Morphine And Related Nausea And Vomiting   CBC:    Component Value Date/Time   WBC 7.5 06/05/2014 0550   HGB 12.2* 06/05/2014 0550   HCT 38.7* 06/05/2014 0550   HCT 37.3* 05/23/2014 0930   PLT 95* 06/05/2014 0550   MCV 97.2 06/05/2014 0550   NEUTROABS 2.1 11/21/2013 1000   LYMPHSABS 1.6 11/21/2013 1000   MONOABS 0.2 11/21/2013 1000   EOSABS 0.1 11/21/2013 1000   BASOSABS 0.0 11/21/2013 1000   Comprehensive Metabolic  Panel:    Component Value Date/Time   NA 135 06/05/2014 0550   K 3.7 06/05/2014 0550   CL 103 06/05/2014 0550   CO2 29 06/05/2014 0550   BUN 10 06/05/2014 0550   CREATININE 0.66 06/05/2014 0550   GLUCOSE 112* 06/05/2014 0550   CALCIUM 11.1* 06/05/2014 0550   AST 66* 06/05/2014 0550   ALT 62* 06/05/2014 0550   ALKPHOS 154* 06/05/2014 0550   BILITOT 3.0* 06/05/2014 0550   PROT 6.0 06/05/2014 0550   ALBUMIN 2.0* 06/05/2014 0550    Physical Exam: Vital Signs: BP 153/67 mmHg  Pulse 108  Temp(Src) 98 F (36.7 C) (Oral)  Resp 20  Ht 5\' 11"  (1.803 m)  Wt 101.923 kg (224 lb 11.2 oz)  BMI 31.35 kg/m2  SpO2 95% SpO2: SpO2: 95 % O2 Device: O2 Device: Not Delivered O2 Flow Rate: O2 Flow Rate (L/min): 2 L/min Intake/output summary:  Intake/Output Summary (Last 24 hours) at 06/05/14 1347 Last data filed at 06/05/14 1053  Gross per 24 hour  Intake    240 ml  Output   1050 ml  Net   -810 ml   LBM:   Baseline Weight: Weight: 97.523 kg (215 lb) Most recent weight: Weight: 101.923 kg (224 lb 11.2 oz)  Exam  Findings: General: Well nourished. He is somnalent. Speech slurred HEENT: Unable to erpform. Pt could not follow commands CV: RRR ,  S1, S2   Respiratory: Rmoist cough. Unlabored. Decreased breath sounds Abdomen: Distended, non tender. Hypoactive bowel sounds; ascites Extremities: MAE x4; 2+ LE edema Skin: No lesions noted. Ecchymosis noted to upper extremities.  Neuro/Psych: Lethargic. Unable to follow commands. Keeps his eyes closed. Not agitated          Palliative Performance Scale: 50            Additional Data Reviewed: Recent Labs     06/03/14  0546  06/04/14  0520  06/05/14  0550  WBC  6.5   --   7.5  HGB  12.7*   --   12.2*  PLT  82*   --   95*  NA  135  136  135  BUN  10  9  10   CREATININE  0.78  0.74  0.66     Time In: 1200 Time Out: 1335 Time Total: 95  Greater than 50%  of this time was spent counseling and coordinating care related to the above assessment and plan. Staffed with CM, RN and Dr. Oda Cogan  Signed by:  Dory Horn, NP  06/05/2014, 1:47 PM  Please contact Palliative Medicine Team phone at [phone] for questions and concerns.

## 2014-06-05 NOTE — Progress Notes (Signed)
Physical Therapy Treatment Patient Details Name: Thomas Navarro MRN: [id number] DOB: March 07, 1950 Today's Date: 06/05/2014    History of Present Illness Thomas Navarro is a 64 y.o. male with a past medical history of chronic back pain on narcotic analgesics for the past year, alcohol abuse, presenting to the emergency department 05/22/14 after attempting to check himself into [facility] and was referred to [facility] instead with complaints of mental status changes per wife for several days.    PT Comments    Pt not showing much progress with his cognitive status.  [place] hopes to take him home but not in current condition and level od mobility.  Assisted pt OOB to [facility] for a large BM.  Assisted with hygiene then amb in hallway twice with spouse.  Required + 2 Max Assist. Very unsteady/drunken gait.  HIGH FALL RISK. Mobility limited by cognitive impairment.  Delayed processing and delayed reactive response.  Follow Up Recommendations  SNF (Wife hoping to take pt home)     Equipment Recommendations       Recommendations for Other Services       Precautions / Restrictions Precautions Precautions: Fall Precaution Comments: AMS Restrictions Weight Bearing Restrictions: No    Mobility  Bed Mobility Overal bed mobility: Needs Assistance Bed Mobility: Supine to Sit     Supine to sit: Max assist     General bed mobility comments: assist with upper body and repeat cueing/direction to stay alert and on task. groggy/slow to respond.  Transfers Overall transfer level: Needs assistance Equipment used: None Transfers: Sit to/from Stand Sit to Stand: +2 physical assistance;Max assist;+2 safety/equipment         General transfer comment: + 2 for safety.  Mobility imhibited by poor cogniiton. 100% cueing for direction and hand over hand cueing needed as well.  Assisted from bed to [facility] with much effort.   Ambulation/Gait Ambulation/Gait assistance: Max assist;+2 safety/equipment;+2  physical assistance Ambulation Distance (Feet): 115 Feet Assistive device: None Gait Pattern/deviations: Step-to pattern;Step-through pattern;Trunk flexed Gait velocity: decreased   General Gait Details: + 2 hand held assist.  Very groggy.  Very unsteady.  Unable to use    Financial trader [employer] (Stroke Patients Only)       Balance                                    Cognition Arousal/Alertness: Awake/alert Behavior During Therapy:  (groggy) Overall Cognitive Status: Impaired/Different from baseline Area of Impairment: Attention         Safety/Judgement: Decreased awareness of safety;Decreased awareness of deficits Awareness: Emergent Problem Solving: Slow processing      Exercises      General Comments        Pertinent Vitals/Pain Pain Assessment: No/denies pain    Home Living                      Prior Function            PT Goals (current goals can now be found in the care plan section)      Frequency  Min 3X/week    PT Plan      Co-evaluation             End of Session Equipment Utilized During Treatment: Gait belt Activity Tolerance: Treatment limited secondary  to medical complications (Comment)       Time: 8333-8329 PT Time Calculation (min) (ACUTE ONLY): 30 min  Charges:  $Gait Training: 8-22 mins $Therapeutic Activity: 8-22 mins                    G Codes:      Rica Koyanagi  PTA [facility]  Acute  Rehab Pager      [phone]

## 2014-06-05 NOTE — Progress Notes (Addendum)
[facility] PROGRESS NOTE Interim History: 64 year old male with a history of chronic back pain on chronic opioids, alcohol dependence, alcoholic liver disease presented with 4-5 day history of worsening confusion. Apparently, the patient drinks approximately 1/5 of liquor every week as well as beer on a daily basis. However, he does not consider beer alcohol infrequently minimizes his use. The patient was at [facility] on 05/22/2014. Because of his medical instability, the patient was sent to the emergency department. The patient's wife states that the patient may have been using more oxycodone than prescribed. Since admission, the patient was placed on alcohol withdrawal protocol. The patient's agitation and aggressive behavior has improved although he remains confused. At this point, his acute confusional state is felt to be multifactorial. Patient has not made substantial improvement and has actually declined since hospitalization. Neurology signed off. [facility]. Consult palliative care team for goals of care.  Assessment/Plan: Acute encephalopathy d/t hepatic encephalopathy and other causes - Multifactorial due to combination of narcotic overuse, alcohol abuse/withdrawal, hepatic encephalopathy, hypercalcemia and hyponatremia. - B12, RBC folate, TSH and RPR are unremarkable. U/Anegative for pyuria, UDS was negative for drugs. - MRI brain on 4.19.2016: No acute intracranial process  - He was initially treated for alcohol withdrawal using CIWA but his acute confusional status has lasted for close to 2 weeks and hence not felt to be related to alcohol withdrawal any more. Continue thiamine. - Narcotics have been minimized - [facility] following and have increased lactulose, continue rifaximin. Requested nursing to clearly document daily BMs with target of 3-5. - Address hypercalcemia as indicated below. - Hyponatremia has resolved. - Optimize sleep hygiene - Seroquel  dose had been reduced yesterday to 25 MG twice a day and will again reduce further to 12.5 MG twice a day. However patient has periodic agitation and did get a dose of IV Haldol overnight.  - Neurology input appreciated. Follow EEG- no seizures - Urine microscopy: No features of UTI. No other clinical suspicion for infections.  Alcohol dependence/alcohol withdrawal: - Not in alcohol withdrawal any longer.  Hypercalcemia: - Treated initially with IV fluids and Lasix. - PTH was 24, PTHr peptide is pending. - As per wife patient had a screening colonoscopy about 5 years ago which was negative. - Urinary calcium to creatinine ratio was 0.01. - Vitamin D levels within normal limits. - Started diuretics 4/24 - Corrected serum calcium on 4/25:12.2-concern for malignant hypercalcemia related to possible HCC - s/p a dose of pamidronate 4/25 and follow calcium -  Pamidronate may take a couple of days to act. Unable to give IV fluids secondary to volume overloaded state - We will give calcitonin IM total for 2 doses and then reevaluate.  Liver lesion: - Previous CT scan with contrast suggested hemangioma, alpha-fetoprotein was greater than 3000. - Wife aware we'll try to get an MRI to better define the lesion. - MRI liver when patient able to cooperate for procedure.   Chronic thrombocytopenia: - Likely due to alcohol abuse and liver disease- has been stable.  Hyponatremia: Likely a combination with volume depletion and his cirrhosis. resolved  Chronic back pain: - have d/c tramadol  Cirrhosis/coagulopathy : Likely due to alcohol and hepatitis C (HCV: 213086)  Acute hypoxic respiratory failure  - Secondary to volume overload from advanced liver disease. - 2-D echo  Fall on 4/25 - No overt injuries - CT head without acute findings - Safety sitter   Code Status: Full Family Communication: None at bedside  Disposition Plan: Not medically stable for  discharge   Consultants:  GI  Neurology  Palliative care team   Procedures:  MRI brain  MRI liver pending  Antibiotics:  Rifaximin  HPI/Subjective: Patient had 3 loose bowel movements overnight secondary to lactulose, and is confused, less agitated, a febrile, no vomiting, as any chest pain or shortness of breath.  Objective: Filed Vitals:   06/04/14 1405 06/04/14 1410 06/04/14 2222 06/05/14 0500  BP: 120/57  162/73 153/67  Pulse: 102  109 108  Temp: 98.7 F (37.1 C)  97.8 F (36.6 C) 98 F (36.7 C)  TempSrc: Axillary  Axillary Oral  Resp: 22  20 20   Height:      Weight:      SpO2: 87% 94% 96% 95%    Intake/Output Summary (Last 24 hours) at 06/05/14 1235 Last data filed at 06/05/14 1053  Gross per 24 hour  Intake    480 ml  Output   1250 ml  Net   -770 ml   Filed Weights   05/22/14 1712 05/31/14 0900 06/02/14 2140  Weight: 97.523 kg (215 lb) 97.841 kg (215 lb 11.2 oz) 101.923 kg (224 lb 11.2 oz)    Exam:  General: Middle aged male, chronically ill looking, lying comfortably propped up in bed. There is bedside.  Heart: S1 and S2 heard, RRR. No JVD, murmurs. 3+ pitting bilateral leg edema Lungs: Clear to auscultation. No increased work of breathing. Abdomen: Mildly distended but soft and nontender. Normal bowel sounds heard. No organomegaly or masses appreciated. No appreciable ascites.   Neuro: Awake, confused..  Data Reviewed: Basic Metabolic Panel:  Recent Labs Lab 06/01/14 0530 06/02/14 0525 06/03/14 0546 06/04/14 0520 06/05/14 0550  NA 139 139 135 136 135  K 4.0 3.9 3.7 3.7 3.7  CL 109 107 106 104 103  CO2 27 29 27 28 29   GLUCOSE 118* 118* 143* 115* 112*  BUN 9 9 10 9 10   CREATININE 0.61 0.73 0.78 0.74 0.66  CALCIUM 10.7* 10.7* 10.8* 11.5* 11.1*   Liver Function Tests:  Recent Labs Lab 05/31/14 0620 06/02/14 0525 06/03/14 0546 06/05/14 0550  AST 52* 57* 70* 66*  ALT 55* 49 59* 62*  ALKPHOS 124* 117 153* 154*  BILITOT 2.1*  1.6* 1.8* 3.0*  PROT 5.8* 5.8* 6.1 6.0  ALBUMIN 2.2* 2.1* 2.2* 2.0*   No results for input(s): LIPASE, AMYLASE in the last 168 hours.  Recent Labs Lab 05/31/14 0620 06/01/14 0530 06/04/14 0938  AMMONIA 30 41* 48*   CBC:  Recent Labs Lab 05/31/14 0620 06/03/14 0546 06/05/14 0550  WBC 4.3 6.5 7.5  HGB 11.5* 12.7* 12.2*  HCT 37.5* 41.2 38.7*  MCV 99.5 100.2* 97.2  PLT 91* 82* 95*   Cardiac Enzymes: No results for input(s): CKTOTAL, CKMB, CKMBINDEX, TROPONINI in the last 168 hours. BNP (last 3 results) No results for input(s): BNP in the last 8760 hours.  ProBNP (last 3 results) No results for input(s): PROBNP in the last 8760 hours.  CBG: No results for input(s): GLUCAP in the last 168 hours.  No results found for this or any previous visit (from the past 240 hour(s)).   Studies: Ct Head Wo Contrast  06/03/2014   CLINICAL DATA:  Found on floor.  Agitated.  Alert and oriented.  EXAM: CT HEAD WITHOUT CONTRAST  TECHNIQUE: Contiguous axial images were obtained from the base of the skull through the vertex without intravenous contrast.  COMPARISON:  05/22/2014  FINDINGS: Ventricles, cisterns and other  CSF spaces are within normal. There is no mass, mass effect, shift of midline structures or acute hemorrhage. No evidence of acute infarction. Subtle chronic ischemic microvascular disease. Remaining bones and soft tissues are within normal  IMPRESSION: No acute intracranial findings.  Minimal chronic ischemic microvascular disease.   Electronically Signed   By: Marin Olp M.D.   On: 06/03/2014 19:14   Dg Chest Port 1 View  06/05/2014   CLINICAL DATA:  Hypoxia  EXAM: PORTABLE CHEST - 1 VIEW  COMPARISON:  Portable chest x-ray dated June 04, 2014  FINDINGS: The lungs are hypoinflated. The right hemidiaphragm remains higher than the left. The interstitial markings remain increased. The cardiac silhouette remains enlarged. The pulmonary vascularity is indistinct.  IMPRESSION: CHF  with interstitial edema, hypo inflation. Allowing for positioning there has not been significant interval change in the appearance of the chest since yesterday's study.   Electronically Signed   By: David  Martinique M.D.   On: 06/05/2014 07:38   Dg Chest Port 1 View  06/04/2014   CLINICAL DATA:  Hypoxia.  EXAM: PORTABLE CHEST - 1 VIEW  COMPARISON:  None.  FINDINGS: Cardiomegaly is again noted. A diffuse interstitial pattern is compatible with edema. No significant focal airspace consolidation is present. The visualized soft tissues and bony thorax are unremarkable.  IMPRESSION: 1. Cardiomegaly and increased interstitial edema compatible with congestive heart failure.   Electronically Signed   By: San Morelle M.D.   On: 06/04/2014 15:55    Scheduled Meds: . calcitonin  400 Units Intramuscular Q12H  . folic acid  1 mg Oral Daily  . furosemide  40 mg Oral Daily  . lactulose  30 g Oral TID  . lidocaine  1 patch Transdermal Q24H  . multivitamin with minerals  1 tablet Oral Daily  . pantoprazole  40 mg Oral BID  . QUEtiapine  12.5 mg Oral Q12H  . rifaximin  550 mg Oral BID  . sodium chloride  3 mL Intravenous Q12H  . spironolactone  100 mg Oral Daily  . thiamine  100 mg Oral Daily   Continuous Infusions:    Time Spent: 35 minutes  Marayah Higdon, MD,  [facility] Pager [phone]  If 7PM-7AM, please contact night-coverage [email or web address] Password [facility] 06/05/2014, 12:35 PM    LOS: 14 days

## 2014-06-06 ENCOUNTER — Encounter (HOSPITAL_COMMUNITY): Payer: Self-pay | Admitting: Radiology

## 2014-06-06 ENCOUNTER — Inpatient Hospital Stay (HOSPITAL_COMMUNITY): Payer: BLUE CROSS/BLUE SHIELD

## 2014-06-06 DIAGNOSIS — R933 Abnormal findings on diagnostic imaging of other parts of digestive tract: Secondary | ICD-10-CM

## 2014-06-06 DIAGNOSIS — R932 Abnormal findings on diagnostic imaging of liver and biliary tract: Secondary | ICD-10-CM

## 2014-06-06 LAB — COMPREHENSIVE METABOLIC PANEL
ALT: 54 U/L — ABNORMAL HIGH (ref 0–53)
ANION GAP: 8 (ref 5–15)
AST: 51 U/L — AB (ref 0–37)
Albumin: 1.9 g/dL — ABNORMAL LOW (ref 3.5–5.2)
Alkaline Phosphatase: 127 U/L — ABNORMAL HIGH (ref 39–117)
BILIRUBIN TOTAL: 3.6 mg/dL — AB (ref 0.3–1.2)
BUN: 10 mg/dL (ref 6–23)
CALCIUM: 10.7 mg/dL — AB (ref 8.4–10.5)
CO2: 26 mmol/L (ref 19–32)
CREATININE: 0.63 mg/dL (ref 0.50–1.35)
Chloride: 104 mmol/L (ref 96–112)
GFR calc Af Amer: >90 mL/min (ref >90)
GLUCOSE: 113 mg/dL — AB (ref 70–99)
Potassium: 4.1 mmol/L (ref 3.5–5.1)
SODIUM: 138 mmol/L (ref 135–145)
Total Protein: 5.9 g/dL — ABNORMAL LOW (ref 6.0–8.3)

## 2014-06-06 LAB — CBC
HCT: 39.4 % (ref 39.0–52.0)
Hemoglobin: 12 g/dL — ABNORMAL LOW (ref 13.0–17.0)
MCH: 29.7 pg (ref 26.0–34.0)
MCHC: 30.5 g/dL (ref 30.0–36.0)
MCV: 97.5 fL (ref 78.0–100.0)
Platelets: 104 10*3/uL — ABNORMAL LOW (ref 150–400)
RBC: 4.04 MIL/uL — ABNORMAL LOW (ref 4.22–5.81)
RDW: 15 % (ref 11.5–15.5)
WBC: 8.7 10*3/uL (ref 4.0–10.5)

## 2014-06-06 LAB — AMMONIA: Ammonia: 43 umol/L — ABNORMAL HIGH (ref 11–32)

## 2014-06-06 MED ORDER — IOHEXOL 300 MG/ML  SOLN
25.0000 mL | INTRAMUSCULAR | Status: AC
  Administered 2014-06-06 (×2): 25 mL via ORAL

## 2014-06-06 MED ORDER — QUETIAPINE 12.5 MG HALF TABLET
12.5000 mg | ORAL_TABLET | Freq: Every day | ORAL | Status: DC
  Administered 2014-06-06: 12.5 mg via ORAL
  Filled 2014-06-06 (×2): qty 1

## 2014-06-06 MED ORDER — CALCITONIN (SALMON) 200 UNIT/ML IJ SOLN
800.0000 [IU] | Freq: Two times a day (BID) | INTRAMUSCULAR | Status: DC
  Administered 2014-06-06 (×2): 800 [IU] via INTRAMUSCULAR
  Filled 2014-06-06 (×4): qty 4

## 2014-06-06 MED ORDER — SODIUM CHLORIDE 0.9 % IV SOLN
INTRAVENOUS | Status: AC
  Administered 2014-06-06: 13:00:00 via INTRAVENOUS

## 2014-06-06 MED ORDER — FUROSEMIDE 10 MG/ML IJ SOLN
40.0000 mg | Freq: Once | INTRAMUSCULAR | Status: AC
  Administered 2014-06-06: 40 mg via INTRAVENOUS
  Filled 2014-06-06: qty 4

## 2014-06-06 MED ORDER — IOHEXOL 300 MG/ML  SOLN
100.0000 mL | Freq: Once | INTRAMUSCULAR | Status: AC | PRN
  Administered 2014-06-06: 100 mL via INTRAVENOUS

## 2014-06-06 MED ORDER — FUROSEMIDE 10 MG/ML IJ SOLN
20.0000 mg | Freq: Once | INTRAMUSCULAR | Status: AC
Start: 2014-06-06 — End: 2014-06-06
  Administered 2014-06-06: 20 mg via INTRAVENOUS
  Filled 2014-06-06: qty 2

## 2014-06-06 NOTE — Progress Notes (Signed)
Physical Therapy Treatment Patient Details Name: Thomas Navarro MRN: [id number] DOB: 05/26/1950 Today's Date: 06/06/2014    History of Present Illness Thomas Navarro is a 64 y.o. male with a past medical history of chronic back pain on narcotic analgesics for the past year, alcohol abuse, presenting to the emergency department 05/22/14 after attempting to check himself into [facility] and was referred to [facility] instead with complaints of mental status changes per wife for several days.    PT Comments    Pt progressing very slowly with his mobility.  Cognition status shows minimal change.  Pt still requires + 2 MAX assist to amb hand held assist as he is unable to functionally/safely use even a RW.  Very unsteady/groggy/drunken gait.  HIGH  FALL  RISK.  Follow Up Recommendations  SNF     Equipment Recommendations       Recommendations for Other Services       Precautions / Restrictions Precautions Precautions: Fall Precaution Comments: AMS Restrictions Weight Bearing Restrictions: No    Mobility  Bed Mobility               General bed mobility comments: Pt OOB in recliner  Transfers Overall transfer level: Needs assistance Equipment used: None   Sit to Stand: +2 physical assistance;Max assist;+2 safety/equipment Stand pivot transfers: +2 physical assistance;+2 safety/equipment;Max assist       General transfer comment: still requires extensive VC's for direction and to stay on task.  Hand over hand cueing needed for sit to stand and tactile cueing to complete turns prior to sit.  Near fall missing recliner with first sit.   Ambulation/Gait Ambulation/Gait assistance: Max assist;+2 physical assistance Ambulation Distance (Feet): 120 Feet Assistive device: None (pt cognitively unable to correctly use AD) Gait Pattern/deviations: Step-to pattern;Step-through pattern;Trunk flexed Gait velocity: decreased   General Gait Details: + 2 hand held assist.  Unable  to functionally ans safely use AD.  Very unsteady.  Groggy.  Drunken.    Stairs            Wheelchair Mobility    Modified Rankin (Stroke Patients Only)       Balance                                    Cognition Arousal/Alertness: Awake/alert Behavior During Therapy: Impulsive Overall Cognitive Status: Impaired/Different from baseline Area of Impairment: Attention   Current Attention Level: Alternating                Exercises      General Comments        Pertinent Vitals/Pain Pain Assessment: No/denies pain    Home Living                      Prior Function            PT Goals (current goals can now be found in the care plan section) Progress towards PT goals: Progressing toward goals    Frequency  Min 3X/week    PT Plan      Co-evaluation             End of Session Equipment Utilized During Treatment: Gait belt Activity Tolerance: Treatment limited secondary to medical complications (Comment) (cognitive impairement)       Time: 1012-1030 PT Time Calculation (min) (ACUTE ONLY): 18 min  Charges:  $Gait Training: 8-22 mins  G Codes:      Rica Koyanagi  PTA [facility]  Acute  Rehab Pager      [phone]

## 2014-06-06 NOTE — Progress Notes (Signed)
Radiology called this nurse to pass on results of CT scan.  Dr. Waldron Labs on floor and notified of results.

## 2014-06-06 NOTE — Progress Notes (Signed)
CSW rec'd update from [facility] that they are not in network with patient's BCBS policy. Patient discussed in unit rounds today including plans for MRI today- CSW will plan to follow up with wife tomorrow to further discuss the SNF plan as we also have Palliative consulted and involved.   Eduard Clos, MSW, [place]

## 2014-06-06 NOTE — Progress Notes (Signed)
COURTESY NOTE: 64 y/o [place] man with a Hx of hep C/ ETOH, now with multiple liver lesions, multiple lung metastases, and an AFP of 3846.  This is diagnostic of hepatocellular carcinoma and no biopsy is needed.  I discussed the situation with the patient's wife. She understands there is no cure and treatments (sorafenib?) are unlikely to do more that add toxicity to his already difficult situation. Even if sorafenib or a similar agent worked, it would at best prolong his life, not improve his functional status or symptoms.  She is interested in pursuing a palliative/ hospice approach and specificallt requests referral to [facility] of [facility] (where the patient's father was cared for). She does not believe she can care for him at home and does not want to "send him to a nursing home," but would agree to the [facility] in [place].  I will make the patient an appt with our hepatocellular cancer specialist, Dr Burr Medico, as an outpatinet; however if the family is clear on the hospice choice that can be cancelled.

## 2014-06-06 NOTE — Progress Notes (Signed)
   06/06/14 1200  Clinical Encounter Type  Visited With Patient and family together  Visit Type Initial;Spiritual support;Social support  Spiritual Encounters  Spiritual Needs Prayer;Emotional  Stress Factors  Patient Stress Factors None identified  Family Stress Factors None identified  Advance Directives (For Healthcare)  Does patient have an advance directive? No

## 2014-06-06 NOTE — Progress Notes (Signed)
    Progress Note   Subjective  more alert today, no specific complaints   Objective   Vital signs in last 24 hours: Temp:  [97.6 F (36.4 C)-98.7 F (37.1 C)] 98.7 F (37.1 C) (04/28 0606) Pulse Rate:  [113-115] 113 (04/28 0606) Resp:  [20] 20 (04/28 0606) BP: (145-154)/(71-84) 145/84 mmHg (04/28 0606) SpO2:  [93 %] 93 % (04/28 0606) Last BM Date: 06/05/14 General:    white male in NAD Heart:  Slightly tachycardic Abdomen:  Soft, nontender and nondistended. Normal bowel sounds. Neurologic:  More alert today but response time still slower than should be.  Psych:  Cooperative.   Intake/Output from previous day: 04/27 0701 - 04/28 0700 In: 240 [P.O.:240] Out: 1050 [Urine:1050] Intake/Output this shift:    Lab Results:  Recent Labs  06/05/14 0550 06/06/14 0529  WBC 7.5 8.7  HGB 12.2* 12.0*  HCT 38.7* 39.4  PLT 95* 104*   BMET  Recent Labs  06/04/14 0520 06/05/14 0550 06/06/14 0529  NA 136 135 138  K 3.7 3.7 4.1  CL 104 103 104  CO2 28 29 26   GLUCOSE 115* 112* 113*  BUN 9 10 10   CREATININE 0.74 0.66 0.63  CALCIUM 11.5* 11.1* 10.7*   LFT  Recent Labs  06/06/14 0529  PROT 5.9*  ALBUMIN 1.9*  AST 51*  ALT 54*  ALKPHOS 127*  BILITOT 3.6*      Assessment / Plan:   78. 64 year old male with decompensated cirrhosis / hepatic encephalopathy. He has been very slow to recover. EEG with mild nonspecific encephalopathy. On Xifaxan and lactulose 30grams TID.  Medications may be having prolonged effect contributing to persistent encephalopathy.   2. Liver lesions on CTscan two years ago. Patient didn't get follow up MRI, AFP now over 3000. Not sure he can cooperate for MRI but we really need to reassess these lesions. Will get repeat CTscan with contrast.    2. CHF.  On 40mg  of lasix daily and 100mg  of aldactone daily.    LOS: 15 days   Tye Savoy  06/06/2014, 9:27 AM

## 2014-06-06 NOTE — Progress Notes (Signed)
A visit was made with the patient and wife to offer support and presence. Wife was in the process of eating and patient replied that all is well when asked. I told them that I would check back with them some time later. They both said ok.

## 2014-06-06 NOTE — Progress Notes (Signed)
Consultation Note Date: 06/06/2014   Patient Name: Thomas Navarro  DOB: [birth year]  MRN: [id number]  Age / Sex: 64 y.o., male   PCP: No Pcp Per Patient Referring Physician: Albertine Patricia, MD  Reason for Consultation: Establishing goals of care  Palliative Care Assessment and Plan Summary of Established Goals of Care and Medical Treatment Preferences    Palliative Care Discussion Held Today Contacts/Participants in Discussion: Primary Decision Maker: wife  HCPOA:yes  Updated wife as to clinical status and discussion with Dr. Waldron Labs this am. Will attempt to do CT of the abd today. . Reiterated that this would be dependant on pt's ability to cooperate with exam. Disposition otherwise at this point looking like SNF with out patient GI following. Wife and daughter in . He appeared to know his daughter but was confused as to the details of her life. Could not recall his son's name. He ambulated with 2-person assist down the hall. He has a stooped over gait.   Code Status/Advance Care Planning:  Full Code   Symptom Management:   Pain: Medical team continues to try to stream line meds and minimize sedating medications to help resolve . He cont to c/o "L5,S1" back pain   Delirium: Pt a little more lucid this  am. Cont to minimize med's as per medical team    Psycho-social/Spiritual:   Support System: yes. Children  Desire for further Chaplaincy support:no  Prognosis: Unable to determine  Discharge Planning:  Likely SNF. Wife has been looking into placement. Would recommend that out patient palliative care follow in SNF if service available        Chief Complaint/History of Present Illness: Chart reviewed. Pt's ammonia is 43 down from 48. He had 10 BMs yesterday per RN and cont to be confused, still having difficulty following commands. He has sitters at his bed.    Primary Diagnoses  Present on Admission:  . Acute encephalopathy . Alcohol abuse . Chronic back  pain . Hyponatremia . Thrombocytopenia . Alcohol withdrawal  Palliative Review of Systems: Unable to perform. Cont to be confused  I have reviewed the medical record, interviewed the patient and family, and examined the patient. The following aspects are pertinent.  Past Medical History  Diagnosis Date  . Chronic back pain   . PONV (postoperative nausea and vomiting)    History   Social History  . Marital Status: Married    Spouse Name: N/A  . Number of Children: N/A  . Years of Education: N/A   Social History Main Topics  . Smoking status: Never Smoker   . Smokeless tobacco: Never Used  . Alcohol Use: 3.0 oz/week    4 Cans of beer, 1 Shots of liquor per week     Comment: drinks 1/2 of 1/2 of a pint bottle  . Drug Use: 28.00 per week    Special: Oxycodone  . Sexual Activity: Not Currently   Other Topics Concern  . None   Social History Narrative   History reviewed. No pertinent family history. Scheduled Meds: . folic acid  1 mg Oral Daily  . furosemide  40 mg Oral Daily  . lactulose  30 g Oral TID  . lidocaine  1 patch Transdermal Q24H  . multivitamin with minerals  1 tablet Oral Daily  . pantoprazole  40 mg Oral BID  . QUEtiapine  12.5 mg Oral Q12H  . rifaximin  550 mg Oral BID  . sodium chloride  3 mL Intravenous Q12H  . spironolactone  100 mg Oral Daily  . thiamine  100 mg Oral Daily   Continuous Infusions:  PRN Meds:.acetaminophen **OR** acetaminophen, haloperidol lactate, ondansetron **OR** ondansetron (ZOFRAN) IV, oxyCODONE Medications Prior to Admission:  Prior to Admission medications   Medication Sig Start Date End Date Taking? Authorizing Provider  diazepam (VALIUM) 5 MG tablet Take 1 tablet by mouth 3 (three) times daily as needed for anxiety.  03/12/13  Yes Historical Provider, MD  loperamide (IMODIUM A-D) 2 MG tablet Take 2 mg by mouth 4 (four) times daily as needed for diarrhea or loose stools.   Yes Historical Provider, MD  Oxycodone HCl 10 MG  TABS Take 5-10 mg by mouth 3 (three) times daily. Take up to 3 times a day for back pain   Yes Historical Provider, MD  cephALEXin (KEFLEX) 500 MG capsule Take 1 capsule (500 mg total) by mouth 4 (four) times daily. Patient not taking: Reported on 05/22/2014 11/21/13   Montine Circle, PA-C  oxycodone (ROXICODONE) 5 MG immediate release tablet Take 1-2 tablets (5-10 mg total) by mouth every 6 (six) hours as needed for severe pain. Patient not taking: Reported on 05/22/2014 03/13/13   Virgel Manifold, MD   Allergies  Allergen Reactions  . Morphine And Related Nausea And Vomiting   CBC:    Component Value Date/Time   WBC 8.7 06/06/2014 0529   HGB 12.0* 06/06/2014 0529   HCT 39.4 06/06/2014 0529   HCT 37.3* 05/23/2014 0930   PLT 104* 06/06/2014 0529   MCV 97.5 06/06/2014 0529   NEUTROABS 2.1 11/21/2013 1000   LYMPHSABS 1.6 11/21/2013 1000   MONOABS 0.2 11/21/2013 1000   EOSABS 0.1 11/21/2013 1000   BASOSABS 0.0 11/21/2013 1000   Comprehensive Metabolic Panel:    Component Value Date/Time   NA 138 06/06/2014 0529   K 4.1 06/06/2014 0529   CL 104 06/06/2014 0529   CO2 26 06/06/2014 0529   BUN 10 06/06/2014 0529   CREATININE 0.63 06/06/2014 0529   GLUCOSE 113* 06/06/2014 0529   CALCIUM 10.7* 06/06/2014 0529   AST 51* 06/06/2014 0529   ALT 54* 06/06/2014 0529   ALKPHOS 127* 06/06/2014 0529   BILITOT 3.6* 06/06/2014 0529   PROT 5.9* 06/06/2014 0529   ALBUMIN 1.9* 06/06/2014 0529    Physical Exam: Vital Signs: BP 145/84 mmHg  Pulse 113  Temp(Src) 98.7 F (37.1 C) (Oral)  Resp 20  Ht 5' 11"  (1.803 m)  Wt 101.923 kg (224 lb 11.2 oz)  BMI 31.35 kg/m2  SpO2 93% SpO2: SpO2: 93 % O2 Device: O2 Device: Not Delivered O2 Flow Rate: O2 Flow Rate (L/min): 2 L/min Intake/output summary:  Intake/Output Summary (Last 24 hours) at 06/06/14 0912 Last data filed at 06/06/14 0102  Gross per 24 hour  Intake    120 ml  Output   1050 ml  Net   -930 ml   LBM:  06/06/14 Baseline Weight:  Weight: 97.523 kg (215 lb) Most recent weight: Weight: 101.923 kg (224 lb 11.2 oz)  Exam Findings: General: Well nourished. Alert but very confused. Speech slurred HEENT: Normal CV: RRR Respiratory: Decreased breath sounds but clear and unlabored Abdomen: Firm, distended, non tender Extremities: LE edema 1+; MAE x4 Skin: No lesions observed Neuro/Psych: Confused, anxious; psychomotor restlessness observed         Palliative Performance Scale: 50%              Additional Data Reviewed: Recent Labs     06/05/14  0550  06/06/14  0529  WBC  7.5  8.7  HGB  12.2*  12.0*  PLT  95*  104*  NA  135  138  BUN  10  10  CREATININE  0.66  0.63     Time In:0900 Time Out: 0945 Time Total: 45  Greater than 50%  of this time was spent counseling and coordinating care related to the above assessment and plan. Met with Dr. Waldron Labs, RN. Will look for CT report and cont to follow pt for palliative needs.  Signed by:  Dory Horn, NP  06/06/2014, 9:12 AM  Please contact Palliative Medicine Team phone at [phone] for questions and concerns.

## 2014-06-06 NOTE — Progress Notes (Addendum)
[facility] PROGRESS NOTE Interim History: 64 year old male with a history of chronic back pain on chronic opioids, alcohol dependence, alcoholic liver disease presented with 4-5 day history of worsening confusion. Apparently, the patient drinks approximately 1/5 of liquor every week as well as beer on a daily basis. However, he does not consider beer alcohol infrequently minimizes his use. The patient was at [facility] on 05/22/2014. Because of his medical instability, the patient was sent to the emergency department. The patient's wife states that the patient may have been using more oxycodone than prescribed. Since admission, the patient was placed on alcohol withdrawal protocol. The patient's agitation and aggressive behavior has improved although he remains confused. At this point, his acute confusional state is felt to be multifactorial. Patient has not made substantial improvement and has actually declined since hospitalization. Neurology signed off. [facility] consulting. Consult palliative care team for goals of care.  Assessment/Plan: Acute encephalopathy d/t hepatic encephalopathy and other causes - Multifactorial due to combination of narcotic overuse, alcohol abuse/withdrawal, hepatic encephalopathy with elevated ammonia, hypercalcemia and hyponatremia. - B12, RBC folate, TSH and RPR are unremarkable. U/Anegative for pyuria, UDS was negative for drugs. - MRI brain on 4.19.2016: No acute intracranial process  - He was initially treated for alcohol withdrawal using CIWA but his acute confusional status has lasted for close to 2 weeks and hence not felt to be related to alcohol withdrawal any more. Continue thiamine. - Narcotics have been minimized - [facility] following and have increased lactulose, continue rifaximin, patient responded very well to lactulose, almost in bowel movement yesterday, ammonia is 43 today. - Address hypercalcemia as indicated below. - Hyponatremia  has resolved. - Optimize sleep hygiene - Seroquel dose had been reduced, currently on 12.5 MG twice a day, will decrease to once at bedtime, However patient has periodic agitation and required when necessary Haldol. - Neurology input appreciated. Follow EEG- no seizures - Urine microscopy: No features of UTI. No other clinical suspicion for infections.  Alcohol dependence/alcohol withdrawal: - Not in alcohol withdrawal any longer.  Hypercalcemia: - Treated initially with IV fluids and Lasix. - PTH was 24, PTHr peptide within normal limits - As per wife patient had a screening colonoscopy about 5 years ago which was negative. - Urinary calcium to creatinine ratio was 0.01. - Vitamin D levels within normal limits. - Started diuretics 4/24 - concern for malignant hypercalcemia related to possible HCC - s/p a dose of pamidronate 4/25 and follow calcium -  Pamidronate may take a couple of days to act.  - Started on IM calcitonin, will give IV fluids total of 1.5 L of normal saline, with IV Lasix total of 60 IV today as well, to help with his hypercalcemia, and avoid volume overload.  Liver lesion: - Previous CT scan with contrast suggested hemangioma, alpha-fetoprotein was greater than 3000, high suspicion for hepatoma. - MRI liver is hard to do on this patient as he wants to be able to cooperate. - CT abdomen and pelvis with contrast ordered by GI  Chronic thrombocytopenia: - Likely due to alcohol abuse and liver disease- has been stable.  Hyponatremia: Likely a combination with volume depletion and his cirrhosis. resolved  Chronic back pain: - have d/c tramadol  Cirrhosis/coagulopathy : Likely due to alcohol and hepatitis C (HCV: 630160)  Acute hypoxic respiratory failure  - Secondary to volume overload from advanced liver disease.   Fall on 4/25 - No overt injuries - CT head without acute findings -  Safety sitter   Code Status: Full Family Communication: None at bedside   Disposition Plan: Not medically stable for discharge   Consultants:  GI  Neurology  Palliative care team   Procedures:  MRI brain   Antibiotics:  Rifaximin  HPI/Subjective: Patient had 3 loose bowel movements overnight secondary to lactulose, and is confused, less agitated, a febrile, no vomiting, as any chest pain or shortness of breath.  Objective: Filed Vitals:   06/04/14 2222 06/05/14 0500 06/05/14 1405 06/06/14 0606  BP: 162/73 153/67 154/71 145/84  Pulse: 109 108 115 113  Temp: 97.8 F (36.6 C) 98 F (36.7 C) 97.6 F (36.4 C) 98.7 F (37.1 C)  TempSrc: Axillary Oral Oral Oral  Resp: 20 20 20 20   Height:      Weight:      SpO2: 96% 95%  93%    Intake/Output Summary (Last 24 hours) at 06/06/14 1139 Last data filed at 06/06/14 0102  Gross per 24 hour  Intake    120 ml  Output    800 ml  Net   -680 ml   Filed Weights   05/22/14 1712 05/31/14 0900 06/02/14 2140  Weight: 97.523 kg (215 lb) 97.841 kg (215 lb 11.2 oz) 101.923 kg (224 lb 11.2 oz)    Exam:  General: Middle aged male, chronically ill looking, lying comfortably propped up in bed. There is bedside sitter. Heart: S1 and S2 heard, RRR. No JVD, murmurs. 3+ pitting bilateral leg edema Lungs: Clear to auscultation. No increased work of breathing. Abdomen: Mildly distended but soft and nontender. Normal bowel sounds heard. No organomegaly or masses appreciated. No appreciable ascites.   Neuro: Awake, confused.communicative.  Data Reviewed: Basic Metabolic Panel:  Recent Labs Lab 06/02/14 0525 06/03/14 0546 06/04/14 0520 06/05/14 0550 06/06/14 0529  NA 139 135 136 135 138  K 3.9 3.7 3.7 3.7 4.1  CL 107 106 104 103 104  CO2 29 27 28 29 26   GLUCOSE 118* 143* 115* 112* 113*  BUN 9 10 9 10 10   CREATININE 0.73 0.78 0.74 0.66 0.63  CALCIUM 10.7* 10.8* 11.5* 11.1* 10.7*   Liver Function Tests:  Recent Labs Lab 05/31/14 0620 06/02/14 0525 06/03/14 0546 06/05/14 0550 06/06/14 0529    AST 52* 57* 70* 66* 51*  ALT 55* 49 59* 62* 54*  ALKPHOS 124* 117 153* 154* 127*  BILITOT 2.1* 1.6* 1.8* 3.0* 3.6*  PROT 5.8* 5.8* 6.1 6.0 5.9*  ALBUMIN 2.2* 2.1* 2.2* 2.0* 1.9*   No results for input(s): LIPASE, AMYLASE in the last 168 hours.  Recent Labs Lab 05/31/14 0620 06/01/14 0530 06/04/14 0938 06/06/14 0529  AMMONIA 30 41* 48* 43*   CBC:  Recent Labs Lab 05/31/14 0620 06/03/14 0546 06/05/14 0550 06/06/14 0529  WBC 4.3 6.5 7.5 8.7  HGB 11.5* 12.7* 12.2* 12.0*  HCT 37.5* 41.2 38.7* 39.4  MCV 99.5 100.2* 97.2 97.5  PLT 91* 82* 95* 104*   Cardiac Enzymes: No results for input(s): CKTOTAL, CKMB, CKMBINDEX, TROPONINI in the last 168 hours. BNP (last 3 results) No results for input(s): BNP in the last 8760 hours.  ProBNP (last 3 results) No results for input(s): PROBNP in the last 8760 hours.  CBG: No results for input(s): GLUCAP in the last 168 hours.  Recent Results (from the past 240 hour(s))  Culture, Urine     Status: None (Preliminary result)   Collection Time: 06/04/14 11:32 AM  Result Value Ref Range Status   Specimen Description URINE, RANDOM  Final  Special Requests none Immunocompromised  Final   Colony Count   Final    >=100,000 COLONIES/ML Performed at [facility]    Culture   Final    [place] Performed at [facility]    Report Status PENDING  Incomplete     Studies: Dg Chest Port 1 View  06/05/2014   CLINICAL DATA:  Hypoxia  EXAM: PORTABLE CHEST - 1 VIEW  COMPARISON:  Portable chest x-ray dated June 04, 2014  FINDINGS: The lungs are hypoinflated. The right hemidiaphragm remains higher than the left. The interstitial markings remain increased. The cardiac silhouette remains enlarged. The pulmonary vascularity is indistinct.  IMPRESSION: CHF with interstitial edema, hypo inflation. Allowing for positioning there has not been significant interval change in the appearance of the chest since yesterday's study.    Electronically Signed   By: David  Martinique M.D.   On: 06/05/2014 07:38   Dg Chest Port 1 View  06/04/2014   CLINICAL DATA:  Hypoxia.  EXAM: PORTABLE CHEST - 1 VIEW  COMPARISON:  None.  FINDINGS: Cardiomegaly is again noted. A diffuse interstitial pattern is compatible with edema. No significant focal airspace consolidation is present. The visualized soft tissues and bony thorax are unremarkable.  IMPRESSION: 1. Cardiomegaly and increased interstitial edema compatible with congestive heart failure.   Electronically Signed   By: San Morelle M.D.   On: 06/04/2014 15:55    Scheduled Meds: . folic acid  1 mg Oral Daily  . furosemide  40 mg Oral Daily  . iohexol  25 mL Oral Q1 Hr x 2  . lactulose  30 g Oral TID  . lidocaine  1 patch Transdermal Q24H  . multivitamin with minerals  1 tablet Oral Daily  . pantoprazole  40 mg Oral BID  . QUEtiapine  12.5 mg Oral Q12H  . rifaximin  550 mg Oral BID  . sodium chloride  3 mL Intravenous Q12H  . spironolactone  100 mg Oral Daily  . thiamine  100 mg Oral Daily   Continuous Infusions:    Time Spent: 35 minutes  Vineeth Fell, MD,  [facility] Pager [phone]  If 7PM-7AM, please contact night-coverage [email or web address] Password [facility] 06/06/2014, 11:39 AM    LOS: 15 days

## 2014-06-06 NOTE — Progress Notes (Signed)
CSW updated [facility] that patient was not medically ready for dc today. Insurance auth clinicals faxed for SNF [place], MSW, [place]

## 2014-06-07 LAB — COMPREHENSIVE METABOLIC PANEL
ALBUMIN: 2.1 g/dL — AB (ref 3.5–5.2)
ALT: 57 U/L — ABNORMAL HIGH (ref 0–53)
AST: 62 U/L — ABNORMAL HIGH (ref 0–37)
Alkaline Phosphatase: 151 U/L — ABNORMAL HIGH (ref 39–117)
Anion gap: 4 — ABNORMAL LOW (ref 5–15)
BILIRUBIN TOTAL: 5 mg/dL — AB (ref 0.3–1.2)
BUN: 11 mg/dL (ref 6–23)
CO2: 27 mmol/L (ref 19–32)
Calcium: 10.1 mg/dL (ref 8.4–10.5)
Chloride: 106 mmol/L (ref 96–112)
Creatinine, Ser: 0.64 mg/dL (ref 0.50–1.35)
GFR calc Af Amer: >90 mL/min (ref >90)
GFR calc non Af Amer: >90 mL/min (ref >90)
Glucose, Bld: 110 mg/dL — ABNORMAL HIGH (ref 70–99)
Potassium: 4 mmol/L (ref 3.5–5.1)
Sodium: 137 mmol/L (ref 135–145)
TOTAL PROTEIN: 6.5 g/dL (ref 6.0–8.3)

## 2014-06-07 LAB — URINE CULTURE: Colony Count: >=100000

## 2014-06-07 MED ORDER — LORAZEPAM 0.5 MG PO TABS: 0.5000 mg | ORAL_TABLET | ORAL | Status: AC | PRN

## 2014-06-07 MED ORDER — CIPROFLOXACIN HCL 500 MG PO TABS
500.0000 mg | ORAL_TABLET | Freq: Two times a day (BID) | ORAL | Status: DC
  Filled 2014-06-07 (×2): qty 1

## 2014-06-07 MED ORDER — LACTULOSE 10 GM/15ML PO SOLN: 30.0000 g | Freq: Two times a day (BID) | ORAL | Status: AC

## 2014-06-07 MED ORDER — ONDANSETRON HCL 4 MG PO TABS: 4.0000 mg | ORAL_TABLET | Freq: Four times a day (QID) | ORAL | Status: AC | PRN

## 2014-06-07 MED ORDER — OXYCODONE HCL 5 MG PO TABS: 5.0000 mg | ORAL_TABLET | Freq: Four times a day (QID) | ORAL | Status: AC | PRN

## 2014-06-07 MED ORDER — QUETIAPINE FUMARATE 25 MG PO TABS: 12.5000 mg | ORAL_TABLET | Freq: Every day | ORAL | Status: AC

## 2014-06-07 MED ORDER — CIPROFLOXACIN HCL 500 MG PO TABS: 500.0000 mg | ORAL_TABLET | Freq: Two times a day (BID) | ORAL | Status: AC

## 2014-06-07 NOTE — Progress Notes (Signed)
   06/07/14 1000  Clinical Encounter Type  Visited With Patient and family together  Visit Type Follow-up;Spiritual support;Social support  Spiritual Encounters  Spiritual Needs Prayer;Emotional;Grief support

## 2014-06-07 NOTE — Discharge Instructions (Signed)
Patient is for discharge to hospice of [facility], measurement as per the facility physician.

## 2014-06-07 NOTE — Progress Notes (Signed)
    Wife in room. Aware of widespread mets and very emotional. Palliative Care is seeing patient. Wife is talking about Hospice for patient. GI will sign off, call for questions. Thanks Tye Savoy, NP

## 2014-06-07 NOTE — Discharge Summary (Signed)
Thomas Navarro, 64 y.o., DOB 04-10-1950, MRN [id number]. Admission date: 05/22/2014 Discharge Date 06/07/2014 Primary MD No PCP Per Patient Admitting Physician Kelvin Cellar, MD   Accepting facility physician: - Management as per proxy home hospice place for patient comfort.  Admission Diagnosis  Weakness [R53.1] Disorientation [R41.0]  Discharge Diagnosis   Principal Problem:   Acute encephalopathy Active Problems:   Chronic back pain   Alcohol abuse   Hyponatremia   Thrombocytopenia   Alcohol withdrawal   Hypercalcemia   Wernicke encephalopathy   Cirrhosis   Liver masses   Hepatitis C antibody test positive   Acute confusional state   Acute respiratory failure with hypoxia   Palliative care encounter   Back pain   Acute delirium   Weakness   Abnormal magnetic resonance imaging of liver   Abnormal findings on radiological examination of gastrointestinal tract     Past Medical History  Diagnosis Date  . Chronic back pain   . PONV (postoperative nausea and vomiting)     Past Surgical History  Procedure Laterality Date  . Back surgery       Hospital Course See H&P, Labs, Consult and Test reports for all details in brief, patient was admitted for **  Principal Problem:   Acute encephalopathy Active Problems:   Chronic back pain   Alcohol abuse   Hyponatremia   Thrombocytopenia   Alcohol withdrawal   Hypercalcemia   Wernicke encephalopathy   Cirrhosis   Liver masses   Hepatitis C antibody test positive   Acute confusional state   Acute respiratory failure with hypoxia   Palliative care encounter   Back pain   Acute delirium   Weakness   Abnormal magnetic resonance imaging of liver   Abnormal findings on radiological examination of gastrointestinal tract  64 year old male with a history of chronic back pain on chronic opioids, alcohol dependence, alcoholic liver disease presented with 4-5 day history of worsening confusion. Apparently, the patient  drinks approximately 1/5 of liquor every week as well as beer on a daily basis. However, he does not consider beer alcohol infrequently minimizes his use. The patient was at [facility] on 05/22/2014. Because of his medical instability, the patient was sent to the emergency department. The patient's wife states that the patient may have been using more oxycodone than prescribed. Since admission, the patient was placed on alcohol withdrawal protocol. The patient's agitation and aggressive behavior has improved although he remains confused. At this point, his acute confusional state is felt to be multifactorial. Patient has not made substantial improvement and has actually declined since hospitalization. Neurology signed off. [facility]. Consult palliative care team for goals of care. Patient had CT abdomen pelvis with IV contrast on 4/28 she did show multiple liver lesions, with multiple lung metastasis, and it significantly elevated AFBs, diagnostic of carcinoma has been established, patient was seen by oncology service, at this point after discussion with the patient's wife, vision was made for hospice.     Hepatocellular carcinoma -  CT abdomen pelvis with IV contrast on 4/28 she did show multiple liver lesions, with multiple lung metastasis, and it significantly elevated AFBs, known history of hepatitis C, alcoholic liver disease, this is related to hepatocellular carcinoma. - Discussed with the patient's wife, at this point there is no cure, only available treatment would offer only prolongation of life, but no improvement of his life quality, or function status, or symptoms. - Decision was made for hospice, and is being discharged [facility].  Acute encephalopathy d/t hepatic encephalopathy and other causes - Multifactorial due to combination of narcotic overuse, alcohol abuse/withdrawal, hepatic encephalopathy with elevated ammonia, hypercalcemia and  hyponatremia. - B12, RBC folate, TSH and RPR are unremarkable. U/Anegative for pyuria, UDS was negative for drugs. - MRI brain on 4.19.2016: No acute intracranial process  - Urine microscopy: No features of UTI. No other clinical suspicion for infections.  Alcohol dependence/alcohol withdrawal: - Not in alcohol withdrawal any longer.  Hypercalcemia: - Treated initially with IV fluids and Lasix, IV permanganate and calcitonin.  Urine culture growing Klebsiella -  was started on ciprofloxacin.  Chronic thrombocytopenia: - Likely due to alcohol abuse and liver disease- has been stable.  Hyponatremia: Likely a combination with volume depletion and his cirrhosis.   Fall on 4/25 - No overt injuries - CT head without acute findings - Safety sitter  Consultants:  GI  Neurology  Palliative care team    Significant Tests:  See full reports for all details    Ct Head Wo Contrast  06/03/2014   CLINICAL DATA:  Found on floor.  Agitated.  Alert and oriented.  EXAM: CT HEAD WITHOUT CONTRAST  TECHNIQUE: Contiguous axial images were obtained from the base of the skull through the vertex without intravenous contrast.  COMPARISON:  05/22/2014  FINDINGS: Ventricles, cisterns and other CSF spaces are within normal. There is no mass, mass effect, shift of midline structures or acute hemorrhage. No evidence of acute infarction. Subtle chronic ischemic microvascular disease. Remaining bones and soft tissues are within normal  IMPRESSION: No acute intracranial findings.  Minimal chronic ischemic microvascular disease.   Electronically Signed   By: Marin Olp M.D.   On: 06/03/2014 19:14   Ct Head Wo Contrast  05/22/2014   CLINICAL DATA:  64 year old male with altered mental status and confusion for 1 week. Initial encounter.  EXAM: CT HEAD WITHOUT CONTRAST  TECHNIQUE: Contiguous axial images were obtained from the base of the skull through the vertex without intravenous contrast.  COMPARISON:   None.  FINDINGS: Visualized paranasal sinuses and mastoids are clear. No acute osseous abnormality identified. Visualized orbits and scalp soft tissues are within normal limits. Calcified atherosclerosis at the skull base.  Cerebral volume is within normal limits for age. No midline shift, ventriculomegaly, mass effect, evidence of mass lesion, intracranial hemorrhage or evidence of cortically based acute infarction. Gray-white matter differentiation is within normal limits throughout the brain. No suspicious intracranial vascular hyperdensity.  IMPRESSION: Normal for age non contrast CT appearance of the brain.   Electronically Signed   By: Genevie Ann M.D.   On: 05/22/2014 14:28   Mr Brain Wo Contrast  05/28/2014   CLINICAL DATA:  Acute encephalopathy, evaluate Wernicke encephalopathy versus other etiology. History of alcohol abuse, hypercalcemia, thrombocytopenia.  EXAM: MRI HEAD WITHOUT CONTRAST  TECHNIQUE: Multiplanar, multiecho pulse sequences of the brain and surrounding structures were obtained without intravenous contrast.  COMPARISON:  CT of the head February 20, 2014  FINDINGS: Mildly motion degraded examination.  The ventricles and sulci are normal for patient's age. No abnormal parenchymal signal, mass lesions, mass effect. No reduced diffusion to suggest acute ischemia. No susceptibility artifact to suggest hemorrhage. A few punctate supratentorial white matter T2 hyperintensities are less than expected for age. Faint subcentimeter T2 hyperintensity in LEFT thalamus.  No abnormal extra-axial fluid collections. No extra-axial masses though, contrast enhanced sequences would be more sensitive. Normal major intracranial vascular flow voids seen at the skull base. T2 hypointensity  within RIGHT anterior clinoid consistent with pneumatization as seen on prior CT.  Ocular globes and orbital contents are unremarkable though not tailored for evaluation. No abnormal sellar expansion. Mild maxillary sinus mucosal  thickening with bilateral maxillary sinus mucosal retention cyst. Trace RIGHT mastoid effusion. No suspicious calvarial bone marrow signal. No abnormal sellar expansion. Craniocervical junction maintained.  IMPRESSION: No acute intracranial process on this mildly motion degraded examination.  Minimal white matter changes, less than expected for age could represent chronic small vessel ischemic disease. Small perivascular space versus lacunar infarct LEFT thalamus.   Electronically Signed   By: Elon Alas   On: 05/28/2014 22:03   Ct Abdomen Pelvis W Contrast  06/06/2014   CLINICAL DATA:  64 year old male with chronic back pain, 4-5 day history of worsening confusion, history of alcohol and opioid use. Re-evaluate liver lesion noted on prior CT examination, suspected to be a hemangioma.  EXAM: CT ABDOMEN AND PELVIS WITH CONTRAST  TECHNIQUE: Multidetector CT imaging of the abdomen and pelvis was performed using the standard protocol following bolus administration of intravenous contrast.  CONTRAST:  126mL OMNIPAQUE IOHEXOL 300 MG/ML  SOLN  COMPARISON:  CT the abdomen and pelvis 03/13/2013.  FINDINGS: Comment: Study is limited by sub optimal contrast bolus, patient motion, and extensive beam hardening artifact from the patient's arms overlying the upper abdomen.  Lower chest: Multiple pulmonary nodules noted above the visualize lung bases, measuring up to 16 mm in the left lower lobe (image 9 of series 7), most compatible with widespread metastatic disease to the lungs. Small right pleural effusion layering dependently. Atherosclerotic calcifications in the left main, left anterior descending and left circumflex coronary arteries. Moderate-sized hiatal hernia.  Hepatobiliary: The liver is poorly evaluated secondary to extensive beam hardening artifact from the patient's arms in the down position, as well as extensive patient motion. With these limitations in mind, there are several ill-defined hypovascular  areas in the liver (particularly in the right lobe), the largest of which is estimated to measure approximately 4.1 x 3.6 cm in segments 7 and 8 (image 25 of series 3). Liver has a nodular contour, suggesting a background of cirrhosis. Arterial phase images are very sub optimal, but no definite hypervascular hepatic lesion is identified. The main portal vein and left branch of the portal vein are patent, while the right branch of the portal vein appears completely occluded. No hepatic biliary ductal dilatation. Gallbladder is largely contracted, but otherwise unremarkable in appearance  Pancreas: Unremarkable.  Spleen: Spleen is enlarged measuring 15.9 x 7.0 x 14.1 cm (estimated splenic volume of 785 mL).  Adrenals/Urinary Tract: 2.2 cm exophytic low-attenuation lesion in the anterior aspect of the upper pole of the right kidney is compatible with a simple cyst. Left kidney is normal in appearance. 8 mm nodule in the lateral limb of the right adrenal gland is too small to characterize, but is similar to prior study 03/13/2013, favored to be benign. No hydroureteronephrosis. Urinary bladder is normal in appearance.  Stomach/Bowel: The intra abdominal portion of the stomach is normal. No pathologic dilatation of small bowel or colon.  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 Atherosclerosis throughout the abdominal and pelvic vasculature, without evidence of aneurysm. Extensive upper abdominal and retroperitoneal lymphadenopathy, with the largest lymph node in the celiac axis nodal station measuring up to 5.5 x 3.6 cm. The largest periaortic lymph nodes measure  up to 2.9 cm in short axis (image 74 of series 2). No pelvic lymphadenopathy.  Reproductive: Prostate gland and seminal vesicles are unremarkable in appearance.  Other: Small volume of ascites.  No  pneumoperitoneum.  Musculoskeletal: There are no aggressive appearing lytic or blastic lesions noted in the visualized portions of the skeleton.  IMPRESSION: 1. Widespread metastatic disease with extensive abdominal and retroperitoneal lymphadenopathy, as well as innumerable pulmonary nodules in the lungs bilaterally, and small right-sided pleural effusion which may be malignant. 2. In addition, of the liver is very heterogeneous in appearance, particularly in the right lobe of the liver where there are several poorly defined lesions. This is associated with right portal vein thrombosis. The possibility of a large infiltrative neoplasm such as a hepatocellular carcinoma should be considered, particularly in the setting of cirrhosis, however, today's imaging is suboptimal due to a variety of technical and patient related factors. This could be better evaluated with MRI of the abdomen with and without IV gadolinium (preferably Eovist), however, without adequate patient cooperation and adequate patient breath holding, such an examination would likely be nondiagnostic. 3. Splenomegaly. 4. Small volume of ascites. 5. Additional incidental findings, as above. These results will be called to the ordering clinician or representative by the Radiologist Assistant, and communication documented in the PACS or zVision Dashboard.   Electronically Signed   By: Vinnie Langton M.D.   On: 06/06/2014 15:27   Dg Chest Port 1 View  06/05/2014   CLINICAL DATA:  Hypoxia  EXAM: PORTABLE CHEST - 1 VIEW  COMPARISON:  Portable chest x-ray dated June 04, 2014  FINDINGS: The lungs are hypoinflated. The right hemidiaphragm remains higher than the left. The interstitial markings remain increased. The cardiac silhouette remains enlarged. The pulmonary vascularity is indistinct.  IMPRESSION: CHF with interstitial edema, hypo inflation. Allowing for positioning there has not been significant interval change in the appearance of the chest since  yesterday's study.   Electronically Signed   By: David  Martinique M.D.   On: 06/05/2014 07:38   Dg Chest Port 1 View  06/04/2014   CLINICAL DATA:  Hypoxia.  EXAM: PORTABLE CHEST - 1 VIEW  COMPARISON:  None.  FINDINGS: Cardiomegaly is again noted. A diffuse interstitial pattern is compatible with edema. No significant focal airspace consolidation is present. The visualized soft tissues and bony thorax are unremarkable.  IMPRESSION: 1. Cardiomegaly and increased interstitial edema compatible with congestive heart failure.   Electronically Signed   By: San Morelle M.D.   On: 06/04/2014 15:55   Dg Chest Port 1 View  05/22/2014   CLINICAL DATA:  Weakness today, chronic back pain  EXAM: PORTABLE CHEST - 1 VIEW  COMPARISON:  Portable exam 1425 hours without priors for comparison  FINDINGS: Enlargement of cardiac silhouette with pulmonary vascular congestion, likely accentuated by hypoinflation and technique.  Decreased lung volumes with minimal RIGHT basilar atelectasis.  Minimal central peribronchial thickening.  No acute infiltrate, pleural effusion or pneumothorax.  IMPRESSION: Minimal bronchitic changes and RIGHT basilar atelectasis.   Electronically Signed   By: Lavonia Dana M.D.   On: 05/22/2014 14:48   US Abdomen Limited Ruq  05/23/2014   CLINICAL DATA:  64 year old male with cirrhosis and solitary liver lesion. Subsequent encounter.  EXAM: US ABDOMEN LIMITED - RIGHT UPPER QUADRANT  COMPARISON:  CT Abdomen and Pelvis 03/13/2013.  FINDINGS: Gallbladder:  No gallstones identified. No sonographic Murphy sign noted. Gallbladder wall thickness at the upper limits of normal, 3 mm. No pericholecystic fluid.  Common bile duct:  Diameter: 3 mm, normal  Liver: Mildly diffuse increased liver echo. Nodular superficial liver contour. Vague hypoechoic nodule identified on image 42 measuring 10 mm diameter. Larger more complex lesion on image 47 measuring up to 1.6 cm. This appears to be in the left lobe.  Two mostly  hyperechoic lesions then are identified in the right lobe, 1 near the gallbladder fossa measures 2.9 cm diameter on image 65. A second lesion on image 71 measures 3 cm diameter.  No intrahepatic biliary ductal dilatation.  Other findings: No ascites identified. Negative visible right kidney  IMPRESSION: 1. Cirrhosis with multiple 1.5-3 cm diameter indeterminate liver lesions identified. Outpatient follow-up Abdomen MRI without and with contrast (Liver protocol) is indicated to further characterize, but should be deferred until after discharge from the hospital to facilitate optimal imaging quality. 2. Negative gallbladder.  No biliary obstruction.   Electronically Signed   By: Genevie Ann M.D.   On: 05/23/2014 08:47     Today   Subjective:   Ky Rumple today is  confused , and as headache, chest pain, shortness of breath , nausea or vomiting , positive abdominal pain . Objective:   Blood pressure 126/72, pulse 106, temperature 98.1 F (36.7 C), temperature source Axillary, resp. rate 18, height 5\' 11"  (1.803 m), weight 101.923 kg (224 lb 11.2 oz), SpO2 90 %.  Intake/Output Summary (Last 24 hours) at 06/07/14 1524 Last data filed at 06/07/14 1225  Gross per 24 hour  Intake 1557.5 ml  Output   1250 ml  Net  307.5 ml    Exam  General: Middle aged male, chronically ill looking, lying comfortably propped up in bed. There is bedside sitter. Heart: S1 and S2 heard, RRR. No JVD, murmurs. 3+ pitting bilateral leg edema Lungs: Clear to auscultation. No increased work of breathing. Abdomen: Mildly distended but soft and nontender. Normal bowel sounds heard. No organomegaly or masses appreciated. No appreciable ascites.  Neuro: Awake, confused.communicative. Data Review   Cultures -   CBC w Diff: Lab Results  Component Value Date   WBC 8.7 06/06/2014   HGB 12.0* 06/06/2014   HCT 39.4 06/06/2014   HCT 37.3* 05/23/2014   PLT 104* 06/06/2014   LYMPHOPCT 40 11/21/2013   MONOPCT 6 11/21/2013    EOSPCT 2 11/21/2013   BASOPCT 1 11/21/2013   CMP: Lab Results  Component Value Date   NA 137 06/07/2014   K 4.0 06/07/2014   CL 106 06/07/2014   CO2 27 06/07/2014   BUN 11 06/07/2014   CREATININE 0.64 06/07/2014   PROT 6.5 06/07/2014   ALBUMIN 2.1* 06/07/2014   BILITOT 5.0* 06/07/2014   ALKPHOS 151* 06/07/2014   AST 62* 06/07/2014   ALT 57* 06/07/2014  .  Micro Results Recent Results (from the past 240 hour(s))  Culture, Urine     Status: None   Collection Time: 06/04/14 11:32 AM  Result Value Ref Range Status   Specimen Description URINE, RANDOM  Final   Special Requests none Immunocompromised  Final   Colony Count   Final    >=100,000 COLONIES/ML Performed at [facility]   Final    KLEBSIELLA OXYTOCA Performed at [facility]    Report Status 06/07/2014 FINAL  Final   Organism ID, Bacteria KLEBSIELLA OXYTOCA  Final      Susceptibility   Klebsiella oxytoca - MIC*    AMPICILLIN >=32 RESISTANT Resistant     CEFAZOLIN 16 INTERMEDIATE Intermediate  CEFTRIAXONE <=1 SENSITIVE Sensitive     CIPROFLOXACIN <=0.25 SENSITIVE Sensitive     GENTAMICIN <=1 SENSITIVE Sensitive     LEVOFLOXACIN <=0.12 SENSITIVE Sensitive     NITROFURANTOIN <=16 SENSITIVE Sensitive     TOBRAMYCIN <=1 SENSITIVE Sensitive     TRIMETH/SULFA <=20 SENSITIVE Sensitive     PIP/TAZO <=4 SENSITIVE Sensitive     * KLEBSIELLA OXYTOCA     [facility] Instructions     [facility]ations     Medication List    STOP taking these medications        cephALEXin 500 MG capsule  Commonly known as:  KEFLEX     diazepam 5 MG tablet  Commonly known as:  VALIUM     loperamide 2 MG tablet  Commonly known as:  IMODIUM A-D      TAKE these medications        ciprofloxacin 500 MG tablet  Commonly known as:  CIPRO  Take 1 tablet (500 mg total) by mouth 2 (two) times daily.     lactulose 10 GM/15ML solution   Commonly known as:  CHRONULAC  Take 45 mLs (30 g total) by mouth 2 (two) times daily.     LORazepam 0.5 MG tablet  Commonly known as:  ATIVAN  Take 1 tablet (0.5 mg total) by mouth every 4 (four) hours as needed for anxiety.     ondansetron 4 MG tablet  Commonly known as:  ZOFRAN  Take 1 tablet (4 mg total) by mouth every 6 (six) hours as needed for nausea.     oxyCODONE 5 MG immediate release tablet  Commonly known as:  Oxy IR/ROXICODONE  Take 1 tablet (5 mg total) by mouth every 6 (six) hours as needed for severe pain.     QUEtiapine 25 MG tablet  Commonly known as:  SEROQUEL  Take 0.5 tablets (12.5 mg total) by mouth at bedtime.         Total Time in preparing paper work, data evaluation and todays exam - 35 minutes  Jabre Heo M.D on 06/07/2014 at [place]  [phone]

## 2014-06-07 NOTE — Progress Notes (Addendum)
CSW has sent clinicals to [facility] with Jenny Reichmann who will review referral and advise  Eduard Clos, MSW, [place]

## 2014-06-07 NOTE — Progress Notes (Signed)
Bed offered and accepted at the [facility]- wife emotional but agreeable.  Will have RN call report and EMS to transport.   Eduard Clos, MSW, [place]

## 2014-06-07 NOTE — Progress Notes (Signed)
Patient's wife is at his bedside. She is challenged with knowing what decision to make next. She is teary eyed and not wanting to cry but she says she can't help herself. She doesn't want her husband to see her in this emotional state. I am providing support for her during her journey. She can ask the nurse to call for a chaplain at any time needed.

## 2014-06-07 NOTE — Progress Notes (Signed)
Report called to [facility] at [facility].  Wife and [facility] made aware of patients departure.

## 2014-06-10 ENCOUNTER — Telehealth: Payer: Self-pay | Admitting: Hematology

## 2014-06-10 NOTE — Telephone Encounter (Signed)
new patient referral-left message for patient to return call to schedule np appt °

## 2014-06-11 ENCOUNTER — Telehealth: Payer: Self-pay | Admitting: *Deleted

## 2014-06-11 NOTE — Telephone Encounter (Signed)
Left VM for patient to return call and ask for new patient scheduler, Tiffany. An appointment is being held for him temporarily.

## 2014-07-10 DEATH — deceased

## 2017-04-09 IMAGING — CT CT ABD-PELV W/ CM
2 of 8 series · 14 of 46 positions shown, 16 images · IV contrast (OMNIPAQUE)
Comparison: CT the abdomen and pelvis 03/13/2013.

CLINICAL DATA: 63-year-old male with chronic back pain, 4-5 day
history of worsening confusion, history of alcohol and opioid use.
Re-evaluate liver lesion noted on prior CT examination, suspected to
be a hemangioma.

EXAM:
CT ABDOMEN AND PELVIS WITH CONTRAST
TECHNIQUE: Multidetector CT imaging of the abdomen and pelvis was performed
using the standard protocol following bolus administration of
intravenous contrast.
CONTRAST:  100mL OMNIPAQUE IOHEXOL 300 MG/ML  SOLN

[Series 3: liver portal venous · axial · portal-venous · 0.86mm/px · z∈[-468,-44]mm · 11 of 171 slices shown, 13 images]
[im 15/171  soft-tissue]
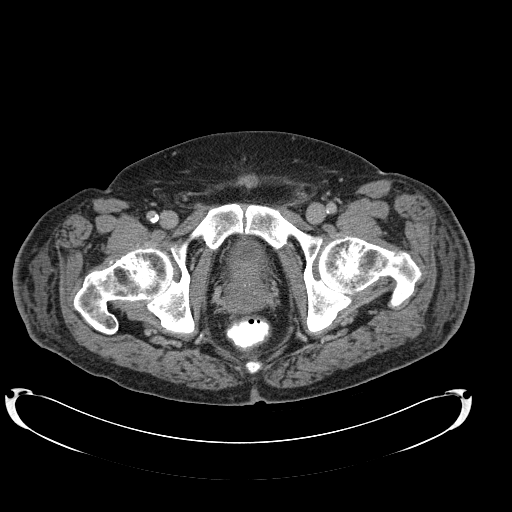
[im 15/171  bone]
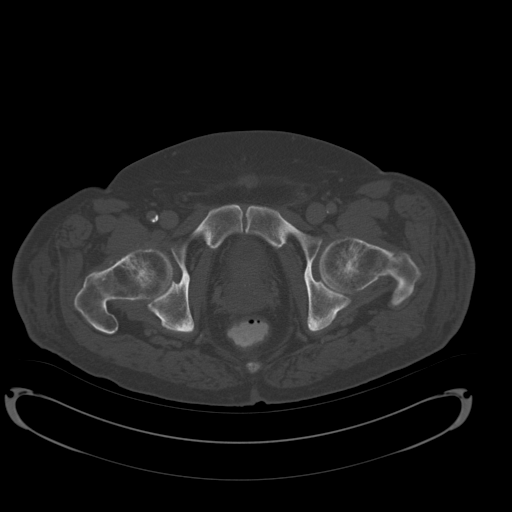
[im 29/171  soft-tissue]
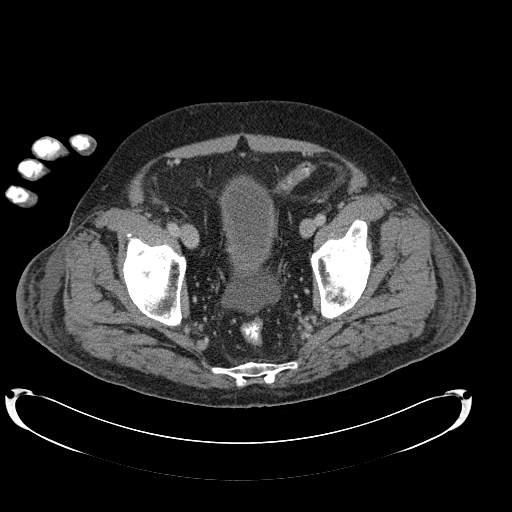
[im 43/171  soft-tissue]
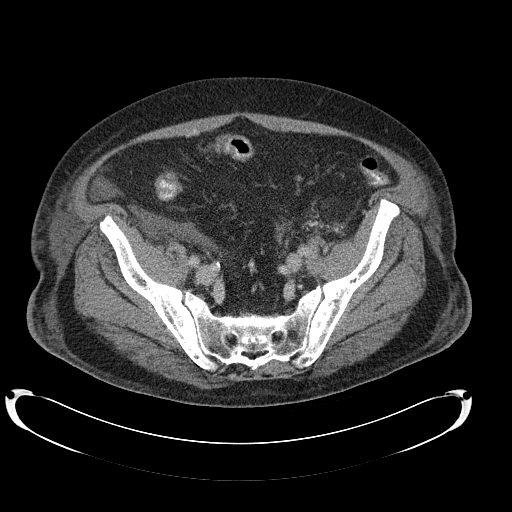
[im 57/171  soft-tissue]
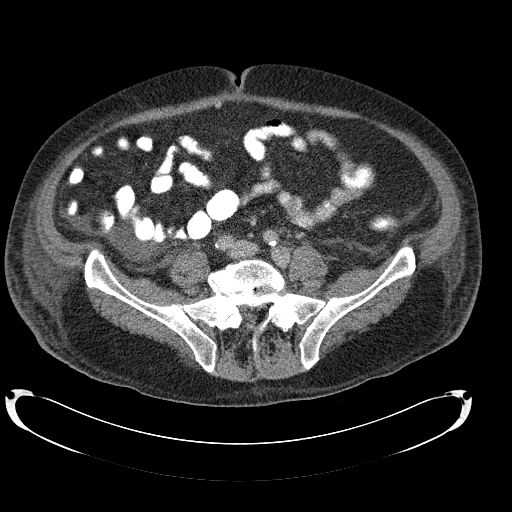
[im 71/171  soft-tissue]
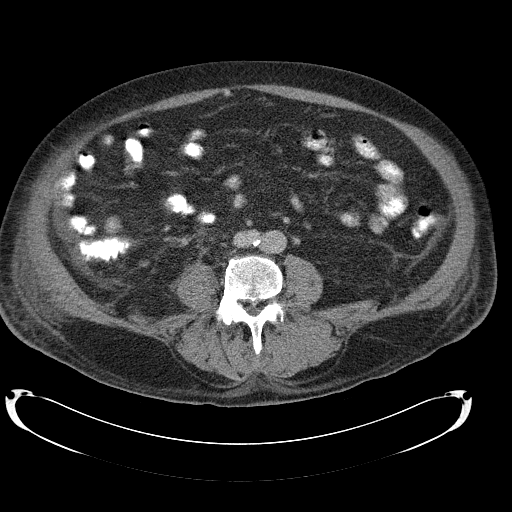
[im 86/171  soft-tissue]
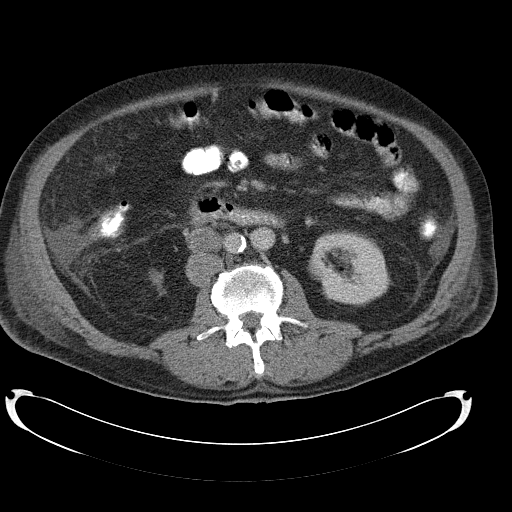
[im 100/171  soft-tissue]
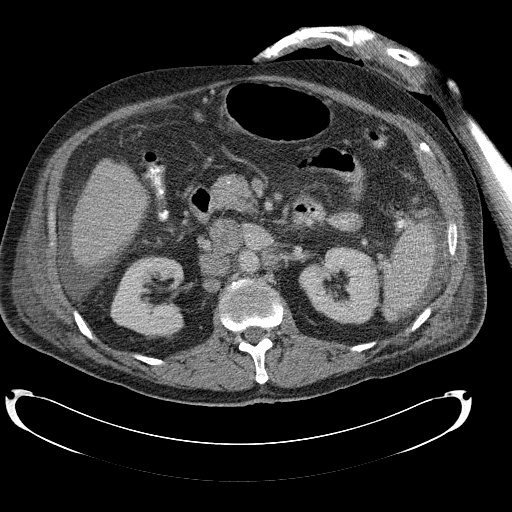
[im 114/171  soft-tissue]
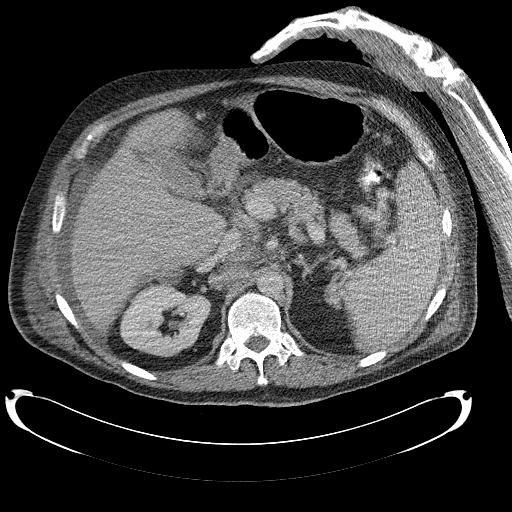
[im 128/171  soft-tissue]
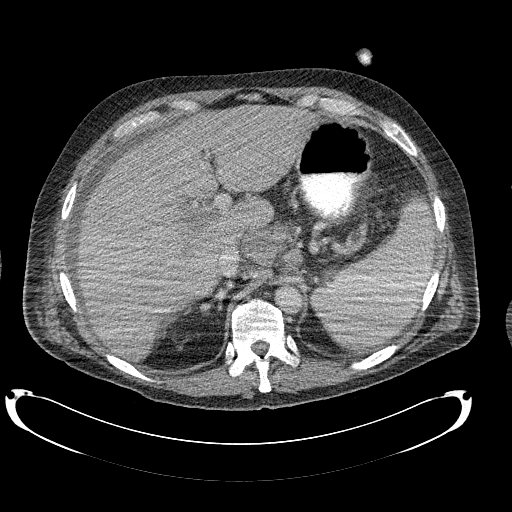
[im 128/171  bone]
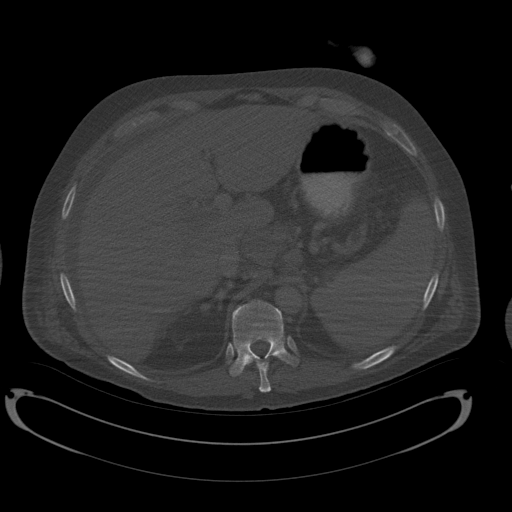
[im 142/171  soft-tissue]
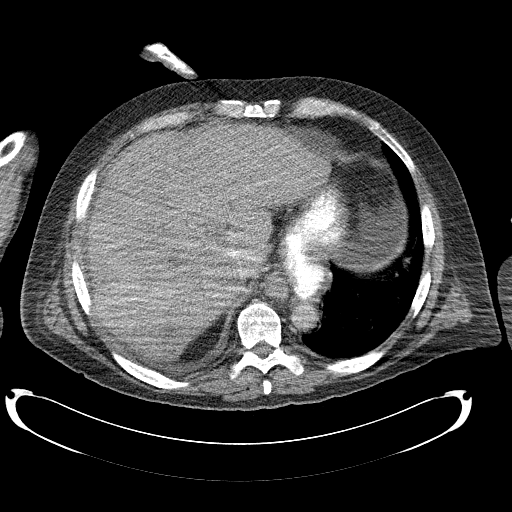
[im 156/171  soft-tissue]
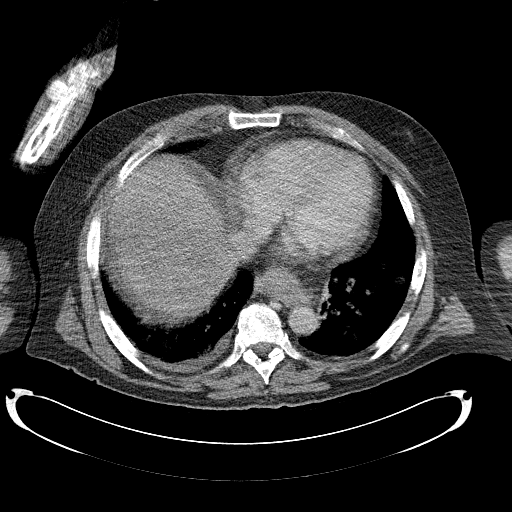

[Series 602: coronal images · coronal · 1.02mm/px · 3 of 153 slices shown]
[im 39/153  soft-tissue]
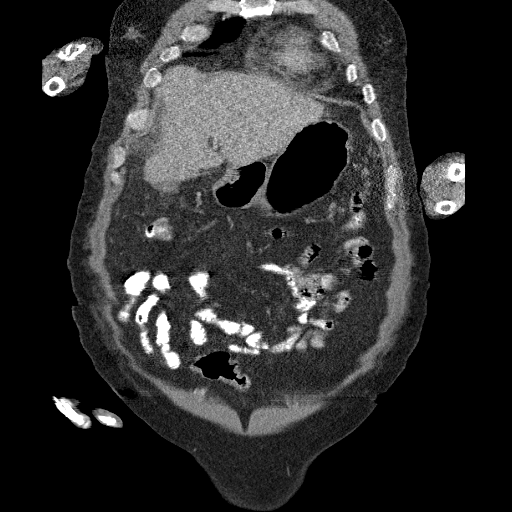
[im 77/153  soft-tissue]
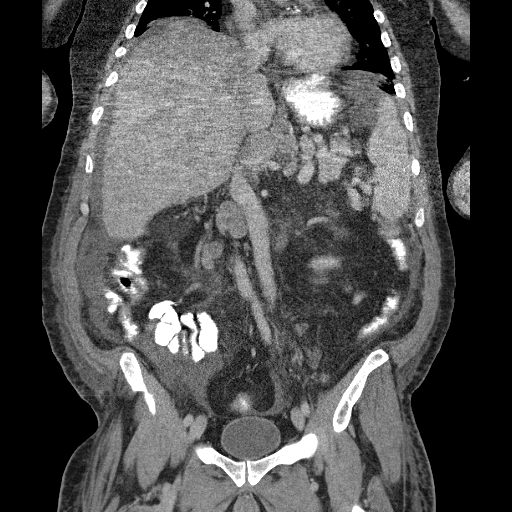
[im 115/153  soft-tissue]
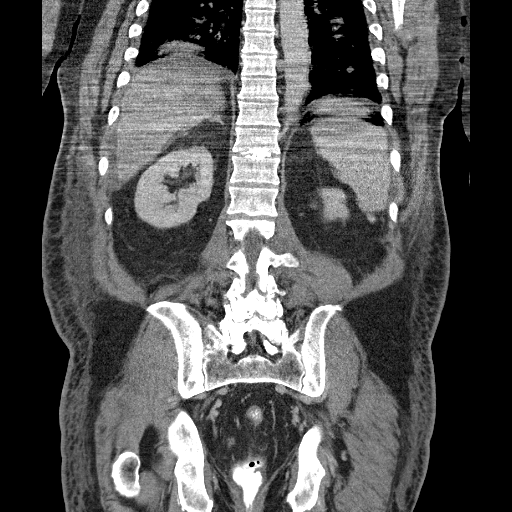

[14 of 46 positions shown; findings below may reference images not displayed]

FINDINGS: Comment: Study is limited by sub optimal contrast bolus, patient
motion, and extensive beam hardening artifact from the patient's
arms overlying the upper abdomen.

Lower chest: Multiple pulmonary nodules noted above the visualize
lung bases, measuring up to 16 mm in the left lower lobe (image 9 of
series 7), most compatible with widespread metastatic disease to the
lungs. Small right pleural effusion layering dependently.
Atherosclerotic calcifications in the left main, left anterior
descending and left circumflex coronary arteries. Moderate-sized
hiatal hernia.

Hepatobiliary: The liver is poorly evaluated secondary to extensive
beam hardening artifact from the patient's arms in the down
position, as well as extensive patient motion. With these
limitations in mind, there are several ill-defined hypovascular
areas in the liver (particularly in the right lobe), the largest of
which is estimated to measure approximately 4.1 x 3.6 cm in segments
7 and 8 (image 25 of series 3). Liver has a nodular contour,
suggesting a background of cirrhosis. Arterial phase images are very
sub optimal, but no definite hypervascular hepatic lesion is
identified. The main portal vein and left branch of the portal vein
are patent, while the right branch of the portal vein appears
completely occluded. No hepatic biliary ductal dilatation.
Gallbladder is largely contracted, but otherwise unremarkable in
appearance

Pancreas: Unremarkable.

Spleen: Spleen is enlarged measuring 15.9 x 7.0 x 14.1 cm (estimated
splenic volume of 785 mL).

Adrenals/Urinary Tract: 2.2 cm exophytic low-attenuation lesion in
the anterior aspect of the upper pole of the right kidney is
compatible with a simple cyst. Left kidney is normal in appearance.
8 mm nodule in the lateral limb of the right adrenal gland is too
small to characterize, but is similar to prior study 03/13/2013,
favored to be benign. No hydroureteronephrosis. Urinary bladder is
normal in appearance.

Stomach/Bowel: The intra abdominal portion of the stomach is normal.
No pathologic dilatation of small bowel or colon.

Vascular/Lymphatic: As discussed above, there is thrombosis of the
right portal vein and its distal branches. Left-sided IVC in the
lower abdomen (normal anatomical variant) incidentally noted.
Atherosclerosis throughout the abdominal and pelvic vasculature,
without evidence of aneurysm. Extensive upper abdominal and
retroperitoneal lymphadenopathy, with the largest lymph node in the
celiac axis nodal station measuring up to 5.5 x 3.6 cm. The largest
periaortic lymph nodes measure up to 2.9 cm in short axis (image 74
of series 2). No pelvic lymphadenopathy.

Reproductive: Prostate gland and seminal vesicles are unremarkable
in appearance.

Other: Small volume of ascites.  No pneumoperitoneum.

Musculoskeletal: There are no aggressive appearing lytic or blastic
lesions noted in the visualized portions of the skeleton.
IMPRESSION: 1. Widespread metastatic disease with extensive abdominal and
retroperitoneal lymphadenopathy, as well as innumerable pulmonary
nodules in the lungs bilaterally, and small right-sided pleural
effusion which may be malignant.
2. In addition, of the liver is very heterogeneous in appearance,
particularly in the right lobe of the liver where there are several
poorly defined lesions. This is associated with right portal vein
thrombosis. The possibility of a large infiltrative neoplasm such as
a hepatocellular carcinoma should be considered, particularly in the
setting of cirrhosis, however, today's imaging is suboptimal due to
a variety of technical and patient related factors. This could be
better evaluated with MRI of the abdomen with and without IV
gadolinium (preferably Eovist), however, without adequate patient
cooperation and adequate patient breath holding, such an examination
would likely be nondiagnostic.
3. Splenomegaly.
4. Small volume of ascites.
5. Additional incidental findings, as above.
These results will be called to the ordering clinician or
representative by the Radiologist Assistant, and communication
documented in the PACS or zVision Dashboard.
